# Patient Record
Sex: Female | Born: 1970 | Race: White | Hispanic: No | Marital: Married | Smoking: Never smoker
Health system: Southern US, Community
[De-identification: ages and names within clinical notes are randomized; demographics above are authoritative.]

## PROBLEM LIST (undated history)

## (undated) DIAGNOSIS — I10 Essential (primary) hypertension: Secondary | ICD-10-CM

## (undated) DIAGNOSIS — Z8659 Personal history of other mental and behavioral disorders: Secondary | ICD-10-CM

## (undated) DIAGNOSIS — R519 Headache, unspecified: Secondary | ICD-10-CM

## (undated) DIAGNOSIS — C801 Malignant (primary) neoplasm, unspecified: Secondary | ICD-10-CM

## (undated) DIAGNOSIS — Z87442 Personal history of urinary calculi: Secondary | ICD-10-CM

## (undated) DIAGNOSIS — Z8249 Family history of ischemic heart disease and other diseases of the circulatory system: Secondary | ICD-10-CM

## (undated) DIAGNOSIS — Z8719 Personal history of other diseases of the digestive system: Secondary | ICD-10-CM

## (undated) DIAGNOSIS — N200 Calculus of kidney: Secondary | ICD-10-CM

## (undated) DIAGNOSIS — Z9889 Other specified postprocedural states: Secondary | ICD-10-CM

## (undated) DIAGNOSIS — I498 Other specified cardiac arrhythmias: Secondary | ICD-10-CM

## (undated) DIAGNOSIS — R112 Nausea with vomiting, unspecified: Secondary | ICD-10-CM

## (undated) DIAGNOSIS — T7840XA Allergy, unspecified, initial encounter: Secondary | ICD-10-CM

## (undated) DIAGNOSIS — J3089 Other allergic rhinitis: Secondary | ICD-10-CM

## (undated) HISTORY — DX: Family history of ischemic heart disease and other diseases of the circulatory system: Z82.49

## (undated) HISTORY — DX: Essential (primary) hypertension: I10

## (undated) HISTORY — DX: Other specified cardiac arrhythmias: I49.8

## (undated) HISTORY — PX: LITHOTRIPSY: SUR834

## (undated) HISTORY — DX: Calculus of kidney: N20.0

## (undated) HISTORY — DX: Personal history of other mental and behavioral disorders: Z86.59

## (undated) HISTORY — DX: Other allergic rhinitis: J30.89

## (undated) HISTORY — DX: Allergy, unspecified, initial encounter: T78.40XA

## (undated) HISTORY — DX: Personal history of other diseases of the digestive system: Z87.19

---
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
## 2012-08-29 DIAGNOSIS — I10 Essential (primary) hypertension: Secondary | ICD-10-CM | POA: Insufficient documentation

## 2012-08-29 DIAGNOSIS — J3089 Other allergic rhinitis: Secondary | ICD-10-CM | POA: Insufficient documentation

## 2013-07-17 DIAGNOSIS — E538 Deficiency of other specified B group vitamins: Secondary | ICD-10-CM | POA: Insufficient documentation

## 2016-04-29 DIAGNOSIS — R591 Generalized enlarged lymph nodes: Secondary | ICD-10-CM | POA: Insufficient documentation

## 2016-10-15 DIAGNOSIS — E785 Hyperlipidemia, unspecified: Secondary | ICD-10-CM | POA: Insufficient documentation

## 2016-10-18 LAB — HM PAP SMEAR

## 2017-06-09 ENCOUNTER — Ambulatory Visit: Payer: Managed Care, Other (non HMO) | Admitting: Family Medicine

## 2017-06-14 ENCOUNTER — Other Ambulatory Visit: Payer: Self-pay

## 2017-06-14 ENCOUNTER — Ambulatory Visit (INDEPENDENT_AMBULATORY_CARE_PROVIDER_SITE_OTHER): Payer: Managed Care, Other (non HMO) | Admitting: Family Medicine

## 2017-06-14 ENCOUNTER — Encounter: Payer: Self-pay | Admitting: Family Medicine

## 2017-06-14 VITALS — BP 130/86 | HR 78 | Temp 98.8°F | Ht 64.0 in | Wt 119.6 lb

## 2017-06-14 DIAGNOSIS — Z1239 Encounter for other screening for malignant neoplasm of breast: Secondary | ICD-10-CM

## 2017-06-14 DIAGNOSIS — Z8659 Personal history of other mental and behavioral disorders: Secondary | ICD-10-CM | POA: Diagnosis not present

## 2017-06-14 DIAGNOSIS — N200 Calculus of kidney: Secondary | ICD-10-CM | POA: Diagnosis not present

## 2017-06-14 DIAGNOSIS — I10 Essential (primary) hypertension: Secondary | ICD-10-CM

## 2017-06-14 DIAGNOSIS — Z8719 Personal history of other diseases of the digestive system: Secondary | ICD-10-CM | POA: Diagnosis not present

## 2017-06-14 DIAGNOSIS — F418 Other specified anxiety disorders: Secondary | ICD-10-CM | POA: Insufficient documentation

## 2017-06-14 DIAGNOSIS — J3089 Other allergic rhinitis: Secondary | ICD-10-CM | POA: Diagnosis not present

## 2017-06-14 DIAGNOSIS — Z8249 Family history of ischemic heart disease and other diseases of the circulatory system: Secondary | ICD-10-CM | POA: Diagnosis not present

## 2017-06-14 DIAGNOSIS — Z1231 Encounter for screening mammogram for malignant neoplasm of breast: Secondary | ICD-10-CM | POA: Diagnosis not present

## 2017-06-14 HISTORY — DX: Personal history of other mental and behavioral disorders: Z86.59

## 2017-06-14 HISTORY — DX: Essential (primary) hypertension: I10

## 2017-06-14 HISTORY — DX: Other allergic rhinitis: J30.89

## 2017-06-14 HISTORY — DX: Family history of ischemic heart disease and other diseases of the circulatory system: Z82.49

## 2017-06-14 MED ORDER — SPIRONOLACTONE 50 MG PO TABS: 50.0000 mg | ORAL_TABLET | Freq: Every day | ORAL | 3 refills | Status: DC

## 2017-06-14 NOTE — Progress Notes (Signed)
Subjective  CC:  Chief Complaint  Patient presents with  . Establish Care    Transfer from [place], last physical in Summer of 2018  . Hypertension    needs refill on Spirolactone     HPI: Angie Farrell is a 47 y.o. female who presents to [place] at [facility] today to establish care with me as a new patient.   She has the following concerns or needs:  Moved to [place] in November 2018; husband transferred here [place]). 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 Happy and healthy mostly:  HTN - see PL. Doing well now on spironalactone and needs refill. Last blood work in October and reportedly normal. No CAD. Strong family history healthy lifestyle. No HLD or DM; non-smoking  H/o panic attacks: associated with son's addiction problems/recovery. Denies mood d/o or GAD. Feels well. Has xanax on hand if needed; rare use.   Perimenopausal: last menses 10/2016. No significant sxs now.   H/o kidney stones and allergies  HM: due mammogram; eye exam up to date: has premature cataracts that need removal. Pap up to date. Believes Tdap is up to date; will check her imm records.   Assessment  1. Essential hypertension   2. Nephrolithiasis   3. History of diverticulitis   4. Breast cancer screening   5. Family history of premature CAD   6. Perennial allergic rhinitis   7. History of panic attacks      Plan   HTN: good control. Refilled meds. Due labs next visit  Set up for mammogram  Chronic problems are well controlled.   Return for cpe.   Follow up:  CPE in 1-6 months Orders Placed This Encounter  Procedures  . MM DIGITAL SCREENING BILATERAL  . HM PAP SMEAR   Meds ordered this encounter  Medications  . spironolactone (ALDACTONE) 50 MG tablet    Sig: Take 1 tablet (50 mg total) by mouth daily.    Dispense:  90 tablet    Refill:  3      We updated and reviewed the patient's past history in detail and it is documented below.    Patient Active Problem List   Diagnosis Date Noted  . Essential hypertension 06/14/2017    Priority: High    Hard to control in past: situational and was using decongestants; had cardiac eval including normal 2D Echo, stress test and event monitor 2012ish Has clonidine for prn use (stress induced HTN response)   . Family history of premature CAD 06/14/2017    Priority: High  . Nephrolithiasis 06/14/2017    Priority: Medium  . History of panic attacks 06/14/2017    Priority: Medium  . History of diverticulitis 06/14/2017    Priority: Low  . Perennial allergic rhinitis 06/14/2017    Priority: Low   Health Maintenance  Topic Date Due  . HIV Screening  05/30/1984  . TETANUS/TDAP  05/30/1988  . INFLUENZA VACCINE  08/18/2017  . PAP SMEAR  10/19/2019    There is no immunization history on file for this patient. Current Meds  Medication Sig  . ALPRAZolam (XANAX) 0.5 MG tablet Take 0.5 mg by mouth daily as needed.  . cloNIDine (CATAPRES) 0.1 MG tablet Take 0.1 mg by mouth 2 (two) times daily as needed.    Allergies: Patient has No Known Allergies. Past Medical History Patient  has a past medical history of Allergy, Ectopic cardiac rhythm, Essential hypertension (06/14/2017), Family history of premature CAD (06/14/2017),  H/O diverticulitis of colon, History of panic attacks (06/14/2017), Kidney stones, and Perennial allergic rhinitis (06/14/2017). Past Surgical History Patient  has a past surgical history that includes Lithotripsy and Tonsillectomy (1981). Family History: Patient family history includes Drug abuse in her son; Healthy in her daughter; Heart disease (age of onset: 41) in her father and maternal grandmother; Hypertension in her father; Lung cancer in her maternal grandmother; Tuberculosis in her father. Social History:  Patient  reports that she has never smoked. She has never used smokeless tobacco. She reports that she drinks alcohol. She reports that she does not use  drugs.  Review of Systems: Constitutional: negative for fever or malaise Ophthalmic: negative for photophobia, double vision or loss of vision Cardiovascular: negative for chest pain, dyspnea on exertion, or new LE swelling Respiratory: negative for SOB or persistent cough Gastrointestinal: negative for abdominal pain, change in bowel habits or melena Genitourinary: negative for dysuria or gross hematuria Musculoskeletal: negative for new gait disturbance or muscular weakness Integumentary: negative for new or persistent rashes Neurological: negative for TIA or stroke symptoms Psychiatric: negative for SI or delusions Allergic/Immunologic: negative for hives  Patient Care Team    Relationship Specialty Notifications Start End  Leamon Arnt, MD PCP - General Family Medicine  06/14/17     Objective  Vitals: BP 130/86   Pulse 78   Temp 98.8 F (37.1 C)   Ht 5\' 4"  (1.626 m)   Wt 119 lb 9.6 oz (54.3 kg)   BMI 20.53 kg/m  General:  Well developed, well nourished, no acute distress , good mm tone and bulk Psych:  Alert and oriented,normal mood and affect HEENT:  Normocephalic, atraumatic, non-icteric sclera, PERRL, no thyromegaly Cardiovascular:  RRR without gallop, rub or murmur, nondisplaced PMI Respiratory:  Good breath sounds bilaterally, CTAB with normal respiratory effort MSK:no peripheral edema Neurologic:    Mental status is normal.  Normal gait   Commons side effects, risks, benefits, and alternatives for medications and treatment plan prescribed today were discussed, and the patient expressed understanding of the given instructions. Patient is instructed to call or message via MyChart if he/she has any questions or concerns regarding our treatment plan. No barriers to understanding were identified. We discussed Red Flag symptoms and signs in detail. Patient expressed understanding regarding what to do in case of urgent or emergency type symptoms.   Medication list was  reconciled, printed and provided to the patient in AVS. Patient instructions and summary information was reviewed with the patient as documented in the AVS. This note was prepared with assistance of Dragon voice recognition software. Occasional wrong-word or sound-a-like substitutions may have occurred due to the inherent limitations of voice recognition software

## 2017-06-14 NOTE — Patient Instructions (Signed)
Please return in 4-6 months for your annual complete physical; please come fasting.  Please check on when you had your last Tdap or Td shot and send me a message in [place].   We will call you with information regarding your referral appointment. Mammogram.   It was a pleasure meeting you today! Thank you for choosing [place] to meet your healthcare needs! I truly look forward to working with you. If you have any questions or concerns, please send me a message via Mychart or call the office at [phone].

## 2017-06-24 LAB — HM MAMMOGRAPHY

## 2017-07-06 ENCOUNTER — Encounter: Payer: Self-pay | Admitting: Emergency Medicine

## 2017-11-04 ENCOUNTER — Telehealth: Payer: Self-pay | Admitting: Family Medicine

## 2017-11-04 NOTE — Telephone Encounter (Signed)
Copied from [place] [id number]. Topic: Quick Communication - See Telephone Encounter >> Nov 04, 2017 12:25 PM Blase Mess A wrote: CRM for notification. See Telephone encounter for: 11/04/17.

## 2017-11-04 NOTE — Telephone Encounter (Signed)
Patient is calling to have a metabolic panel done but she does not want to schedule a CPE.  And she would like labs drawn to check her hormone levels .  Patient is requesting if labs can be taken when she comes in for her flu shot on 11/08/17. [phone]

## 2017-11-06 NOTE — Telephone Encounter (Signed)
We would need an office visit for labwork.  She is overdue for a complete physical. If she declines, then she can schedule and ov for f/u htn and to discuss hormone testing.  I cannot order blood work without evaluation. And I do recommend an annual physical as we discussed.  Thanks.

## 2017-11-07 NOTE — Telephone Encounter (Signed)
Spoke with patient. She declines CPE at this time.  HTN follow-up has been scheduled for tomorrow afternoon.

## 2017-11-08 ENCOUNTER — Ambulatory Visit (INDEPENDENT_AMBULATORY_CARE_PROVIDER_SITE_OTHER): Payer: Managed Care, Other (non HMO) | Admitting: Family Medicine

## 2017-11-08 ENCOUNTER — Ambulatory Visit: Payer: Managed Care, Other (non HMO)

## 2017-11-08 ENCOUNTER — Encounter: Payer: Self-pay | Admitting: Family Medicine

## 2017-11-08 ENCOUNTER — Other Ambulatory Visit: Payer: Self-pay

## 2017-11-08 VITALS — BP 142/96 | HR 79 | Temp 98.1°F | Ht 64.0 in | Wt 123.6 lb

## 2017-11-08 DIAGNOSIS — Z8249 Family history of ischemic heart disease and other diseases of the circulatory system: Secondary | ICD-10-CM | POA: Diagnosis not present

## 2017-11-08 DIAGNOSIS — I10 Essential (primary) hypertension: Secondary | ICD-10-CM | POA: Diagnosis not present

## 2017-11-08 DIAGNOSIS — J3089 Other allergic rhinitis: Secondary | ICD-10-CM | POA: Diagnosis not present

## 2017-11-08 DIAGNOSIS — F430 Acute stress reaction: Secondary | ICD-10-CM | POA: Diagnosis not present

## 2017-11-08 DIAGNOSIS — Z23 Encounter for immunization: Secondary | ICD-10-CM

## 2017-11-08 LAB — COMPREHENSIVE METABOLIC PANEL
ALBUMIN: 4.6 g/dL (ref 3.5–5.2)
ALT: 16 U/L (ref 0–35)
AST: 14 U/L (ref 0–37)
Alkaline Phosphatase: 78 U/L (ref 39–117)
BUN: 10 mg/dL (ref 6–23)
CHLORIDE: 104 meq/L (ref 96–112)
CO2: 31 meq/L (ref 19–32)
Calcium: 9.8 mg/dL (ref 8.4–10.5)
Creatinine, Ser: 0.98 mg/dL (ref 0.40–1.20)
GFR: 64.26 mL/min (ref >60.00)
GLUCOSE: 85 mg/dL (ref 70–99)
POTASSIUM: 4 meq/L (ref 3.5–5.1)
SODIUM: 143 meq/L (ref 135–145)
Total Bilirubin: 0.6 mg/dL (ref 0.2–1.2)
Total Protein: 7.1 g/dL (ref 6.0–8.3)

## 2017-11-08 LAB — LIPID PANEL
CHOL/HDL RATIO: 5
CHOLESTEROL: 207 mg/dL — AB (ref 0–200)
HDL: 45.7 mg/dL (ref >39.00)
LDL CALC: 131 mg/dL — AB (ref 0–99)
NonHDL: 161.34
Triglycerides: 150 mg/dL — ABNORMAL HIGH (ref 0.0–149.0)
VLDL: 30 mg/dL (ref 0.0–40.0)

## 2017-11-08 LAB — CBC WITH DIFFERENTIAL/PLATELET
BASOS PCT: 0.5 % (ref 0.0–3.0)
Basophils Absolute: 0.1 10*3/uL (ref 0.0–0.1)
Eosinophils Absolute: 0.3 10*3/uL (ref 0.0–0.7)
Eosinophils Relative: 2.6 % (ref 0.0–5.0)
HCT: 39 % (ref 36.0–46.0)
Hemoglobin: 13.2 g/dL (ref 12.0–15.0)
LYMPHS PCT: 32.2 % (ref 12.0–46.0)
Lymphs Abs: 3.5 10*3/uL (ref 0.7–4.0)
MCHC: 33.7 g/dL (ref 30.0–36.0)
MCV: 92.3 fl (ref 78.0–100.0)
Monocytes Absolute: 0.5 10*3/uL (ref 0.1–1.0)
Monocytes Relative: 4.6 % (ref 3.0–12.0)
NEUTROS ABS: 6.5 10*3/uL (ref 1.4–7.7)
NEUTROS PCT: 60.1 % (ref 43.0–77.0)
PLATELETS: 278 10*3/uL (ref 150.0–400.0)
RBC: 4.23 Mil/uL (ref 3.87–5.11)
RDW: 12.5 % (ref 11.5–15.5)
WBC: 10.8 10*3/uL — ABNORMAL HIGH (ref 4.0–10.5)

## 2017-11-08 LAB — MAGNESIUM: Magnesium: 2.2 mg/dL (ref 1.5–2.5)

## 2017-11-08 LAB — TSH: TSH: 0.81 u[IU]/mL (ref 0.35–4.50)

## 2017-11-08 NOTE — Telephone Encounter (Signed)
FYI

## 2017-11-08 NOTE — Progress Notes (Signed)
Subjective  CC:  Chief Complaint  Patient presents with  . Hypertension    Patient states that her blood pressures have been running high since Saturday, wants to discuss getting some bloodwork done    HPI: Angie Farrell is a 47 y.o. female who presents to the office today to address the problems listed above in the chief complaint.  Hypertension f/u: Control is fair with several elevated readings over the last 2 weeks; thought to be stress related: daughter had pituitary adenoma removed surgically (lives in [place]). Pt has been traveling to see her and trying to help but it is difficult due to the long distance. She is stressed and working. Has long h/o of hard to control HTN with stress related hypertensive response. Had 2 elevated readings responsive to clonodine (had been told to use prn). BP on avg runs 140/80s - 90s.  Pt reports she can tell when her bp is high - feels tired with headaches. She is taking medications as instructed, no medication side effects noted, no TIAs, no chest pain on exertion, no dyspnea on exertion, no swelling of ankles. She denies adverse effects from his BP medications. Compliance with medication is good.   She is due for labs. She is nonfasting.   Allergies are active w/o sxs of infection.   Assessment  1. Essential hypertension   2. Family history of premature CAD   3. Perennial allergic rhinitis   4. Stress reaction      Plan    Hypertension f/u: BP control is poorly controlled. Significant hypertensive response to stress. Add clonidine bid to aldactone and monitor home readings. Check labs. Education given  Hyperlipidemia f/u: borderline readings in past. Check today and recalculate ascvd risk score. May need statin. Lives health lifestyle  AR - avoid decongestants.   Stress reduction discussed.  Education regarding management of these chronic disease states was given. Management strategies discussed on successive visits include dietary and  exercise recommendations, goals of achieving and maintaining IBW, and lifestyle modifications aiming for adequate sleep and minimizing stressors.   Follow up: No follow-ups on file.  Orders Placed This Encounter  Procedures  . CBC with Differential/Platelet  . Comprehensive metabolic panel  . Lipid panel  . TSH  . Magnesium  . POCT urinalysis dipstick   No orders of the defined types were placed in this encounter.     BP Readings from Last 3 Encounters:  11/08/17 (!) 142/96  06/14/17 130/86   Wt Readings from Last 3 Encounters:  11/08/17 123 lb 9.6 oz (56.1 kg)  06/14/17 119 lb 9.6 oz (54.3 kg)    No results found for: CHOL No results found for: HDL No results found for: LDLCALC No results found for: TRIG No results found for: CHOLHDL No results found for: LDLDIRECT No results found for: CREATININE, BUN, NA, K, CL, CO2  The 10-year ASCVD risk score Mikey Bussing DC Jr., et al., 2013) is: 2.2%   Values used to calculate the score:     Age: 51 years     Sex: Female     Is Non-Hispanic African American: No     Diabetic: No     Tobacco smoker: No     Systolic Blood Pressure: 786 mmHg     Is BP treated: Yes     HDL Cholesterol: 49 mg/dl     Total Cholesterol: 212 mg/dl  I reviewed the patients updated PMH, FH, and SocHx.    Patient Active Problem List   Diagnosis  Date Noted  . Essential hypertension 06/14/2017    Priority: High  . Family history of premature CAD 06/14/2017    Priority: High  . Nephrolithiasis 06/14/2017    Priority: Medium  . History of panic attacks 06/14/2017    Priority: Medium  . History of diverticulitis 06/14/2017    Priority: Low  . Perennial allergic rhinitis 06/14/2017    Priority: Low    Allergies: Patient has no known allergies.  Social History: Patient  reports that she has never smoked. She has never used smokeless tobacco. She reports that she drinks alcohol. She reports that she does not use drugs.  Current Meds  Medication Sig    . cloNIDine (CATAPRES) 0.1 MG tablet Take 0.1 mg by mouth 2 (two) times daily.  Marland Kitchen spironolactone (ALDACTONE) 50 MG tablet Take 1 tablet (50 mg total) by mouth daily.    Review of Systems: Cardiovascular: negative for chest pain, palpitations, leg swelling, orthopnea Respiratory: negative for SOB, wheezing or persistent cough Gastrointestinal: negative for abdominal pain Genitourinary: negative for dysuria or gross hematuria  Objective  Vitals: BP (!) 142/96   Pulse 79   Temp 98.1 F (36.7 C)   Ht 5\' 4"  (1.626 m)   Wt 123 lb 9.6 oz (56.1 kg)   SpO2 96%   BMI 21.22 kg/m  General: no acute distress  Psych:  Alert and oriented, normal mood and affect HEENT:  Normocephalic, atraumatic, supple neck , no thyromeglay Cardiovascular:  RRR without murmur. no edema Respiratory:  Good breath sounds bilaterally, CTAB with normal respiratory effort Skin:  Warm, no rashes Neurologic:   Mental status is normal, no tremor  Commons side effects, risks, benefits, and alternatives for medications and treatment plan prescribed today were discussed, and the patient expressed understanding of the given instructions. Patient is instructed to call or message via MyChart if he/she has any questions or concerns regarding our treatment plan. No barriers to understanding were identified. We discussed Red Flag symptoms and signs in detail. Patient expressed understanding regarding what to do in case of urgent or emergency type symptoms.   Medication list was reconciled, printed and provided to the patient in AVS. Patient instructions and summary information was reviewed with the patient as documented in the AVS. This note was prepared with assistance of Dragon voice recognition software. Occasional wrong-word or sound-a-like substitutions may have occurred due to the inherent limitations of voice recognition software

## 2017-11-08 NOTE — Patient Instructions (Addendum)
Please return in 3 months for for follow up of your hypertension.   Start taking the clonidine twice a day regularly. IF your BP is getting to low, we can decrease the spironolactone to 25mg  (1/2 tab) per day.   I will release your lab results to you on your MyChart account with further instructions. Please reply with any questions.   If you have any questions or concerns, please don't hesitate to send me a message via MyChart or call the office at [phone]. Thank you for visiting with Angie Farrell today! It's our pleasure caring for you.   Hypertension Hypertension, commonly called high blood pressure, is when the force of blood pumping through the arteries is too strong. The arteries are the blood vessels that carry blood from the heart throughout the body. Hypertension forces the heart to work harder to pump blood and may cause arteries to become narrow or stiff. Having untreated or uncontrolled hypertension can cause heart attacks, strokes, kidney disease, and other problems. A blood pressure reading consists of a higher number over a lower number. Ideally, your blood pressure should be below 120/80. The first ("top") number is called the systolic pressure. It is a measure of the pressure in your arteries as your heart beats. The second ("bottom") number is called the diastolic pressure. It is a measure of the pressure in your arteries as the heart relaxes. What are the causes? The cause of this condition is not known. What increases the risk? Some risk factors for high blood pressure are under your control. Others are not. Factors you can change  Smoking.  Having type 2 diabetes mellitus, high cholesterol, or both.  Not getting enough exercise or physical activity.  Being overweight.  Having too much fat, sugar, calories, or salt (sodium) in your diet.  Drinking too much alcohol. Factors that are difficult or impossible to change  Having chronic kidney disease.  Having a family  history of high blood pressure.  Age. Risk increases with age.  Race. You may be at higher risk if you are African-American.  Gender. Men are at higher risk than women before age 17. After age 47, women are at higher risk than men.  Having obstructive sleep apnea.  Stress. What are the signs or symptoms? Extremely high blood pressure (hypertensive crisis) may cause:  Headache.  Anxiety.  Shortness of breath.  Nosebleed.  Nausea and vomiting.  Severe chest pain.  Jerky movements you cannot control (seizures).  How is this diagnosed? This condition is diagnosed by measuring your blood pressure while you are seated, with your arm resting on a surface. The cuff of the blood pressure monitor will be placed directly against the skin of your upper arm at the level of your heart. It should be measured at least twice using the same arm. Certain conditions can cause a difference in blood pressure between your right and left arms. Certain factors can cause blood pressure readings to be lower or higher than normal (elevated) for a short period of time:  When your blood pressure is higher when you are in a health care provider's office than when you are at home, this is called white coat hypertension. Most people with this condition do not need medicines.  When your blood pressure is higher at home than when you are in a health care provider's office, this is called masked hypertension. Most people with this condition may need medicines to control blood pressure.  If you have a high blood pressure reading  during one visit or you have normal blood pressure with other risk factors:  You may be asked to return on a different day to have your blood pressure checked again.  You may be asked to monitor your blood pressure at home for 1 week or longer.  If you are diagnosed with hypertension, you may have other blood or imaging tests to help your health care provider understand your overall  risk for other conditions. How is this treated? This condition is treated by making healthy lifestyle changes, such as eating healthy foods, exercising more, and reducing your alcohol intake. Your health care provider may prescribe medicine if lifestyle changes are not enough to get your blood pressure under control, and if:  Your systolic blood pressure is above 130.  Your diastolic blood pressure is above 80.  Your personal target blood pressure may vary depending on your medical conditions, your age, and other factors. Follow these instructions at home: Eating and drinking  Eat a diet that is high in fiber and potassium, and low in sodium, added sugar, and fat. An example eating plan is called the DASH (Dietary Approaches to Stop Hypertension) diet. To eat this way: ? Eat plenty of fresh fruits and vegetables. Try to fill half of your plate at each meal with fruits and vegetables. ? Eat whole grains, such as whole wheat pasta, brown rice, or whole grain bread. Fill about one quarter of your plate with whole grains. ? Eat or drink low-fat dairy products, such as skim milk or low-fat yogurt. ? Avoid fatty cuts of meat, processed or cured meats, and poultry with skin. Fill about one quarter of your plate with lean proteins, such as fish, chicken without skin, beans, eggs, and tofu. ? Avoid premade and processed foods. These tend to be higher in sodium, added sugar, and fat.  Reduce your daily sodium intake. Most people with hypertension should eat less than 1,500 mg of sodium a day.  Limit alcohol intake to no more than 1 drink a day for nonpregnant women and 2 drinks a day for men. One drink equals 12 oz of beer, 5 oz of wine, or 1 oz of hard liquor. Lifestyle  Work with your health care provider to maintain a healthy body weight or to lose weight. Ask what an ideal weight is for you.  Get at least 30 minutes of exercise that causes your heart to beat faster (aerobic exercise) most days  of the week. Activities may include walking, swimming, or biking.  Include exercise to strengthen your muscles (resistance exercise), such as pilates or lifting weights, as part of your weekly exercise routine. Try to do these types of exercises for 30 minutes at least 3 days a week.  Do not use any products that contain nicotine or tobacco, such as cigarettes and e-cigarettes. If you need help quitting, ask your health care provider.  Monitor your blood pressure at home as told by your health care provider.  Keep all follow-up visits as told by your health care provider. This is important. Medicines  Take over-the-counter and prescription medicines only as told by your health care provider. Follow directions carefully. Blood pressure medicines must be taken as prescribed.  Do not skip doses of blood pressure medicine. Doing this puts you at risk for problems and can make the medicine less effective.  Ask your health care provider about side effects or reactions to medicines that you should watch for. Contact a health care provider if:  You think  you are having a reaction to a medicine you are taking.  You have headaches that keep coming back (recurring).  You feel dizzy.  You have swelling in your ankles.  You have trouble with your vision. Get help right away if:  You develop a severe headache or confusion.  You have unusual weakness or numbness.  You feel faint.  You have severe pain in your chest or abdomen.  You vomit repeatedly.  You have trouble breathing. Summary  Hypertension is when the force of blood pumping through your arteries is too strong. If this condition is not controlled, it may put you at risk for serious complications.  Your personal target blood pressure may vary depending on your medical conditions, your age, and other factors. For most people, a normal blood pressure is less than 120/80.  Hypertension is treated with lifestyle changes, medicines,  or a combination of both. Lifestyle changes include weight loss, eating a healthy, low-sodium diet, exercising more, and limiting alcohol. This information is not intended to replace advice given to you by your health care provider. Make sure you discuss any questions you have with your health care provider. Document Released: 01/04/2005 Document Revised: 12/03/2015 Document Reviewed: 12/03/2015 Elsevier Interactive Patient Education  [employer].

## 2017-12-28 ENCOUNTER — Other Ambulatory Visit: Payer: Self-pay | Admitting: Family Medicine

## 2017-12-28 NOTE — Telephone Encounter (Signed)
Patient would like a prescription of Clonidine to take every other day, because it works better.  [id number] - [place], [address] [place] [phone] (Phone) [phone] (Fax)

## 2017-12-28 NOTE — Telephone Encounter (Signed)
Copied from [place] [id number]. Topic: Quick Communication - Rx Refill/Question >> Dec 28, 2017  9:45 AM Alfredia Ferguson R wrote: Medication: cloNIDine (CATAPRES) 0.1 MG tablet , would like new script to take once every other day due to working better  Has the patient contacted their pharmacy? Yes ,   Preferred Pharmacy (with phone number or street name): [id number] - [place], [address] [phone] (Phone) [phone] (Fax)    Agent: Please be advised that RX refills may take up to 3 business days. We ask that you follow-up with your pharmacy.

## 2017-12-28 NOTE — Telephone Encounter (Signed)
Routing to PCP

## 2017-12-29 NOTE — Telephone Encounter (Signed)
I would not recommend using hydralazine every other day.  Please continue bid dosing and refill if she needs that.  If she is having problems with her blood pressure, please schedule an office visit to discuss. I do not want to adjust medications without evaluating her blood pressure readings.

## 2017-12-29 NOTE — Telephone Encounter (Signed)
Patient states that taking the Clonidine 2 times per day drops her blood pressure too low. She said taking it every other day, controls her blood pressure. I advised patient in order to change the dosages of her medications she would need to make an appointment for an office visit. I offered to schedule an appointment for her and the phone cut off, I called the patient back to see if she would like to schedule and she replied no.   Doloris Hall,  LPN

## 2020-04-25 ENCOUNTER — Ambulatory Visit: Payer: Managed Care, Other (non HMO) | Admitting: Cardiology

## 2020-05-08 DIAGNOSIS — I701 Atherosclerosis of renal artery: Secondary | ICD-10-CM | POA: Diagnosis present

## 2020-06-10 ENCOUNTER — Other Ambulatory Visit: Payer: Self-pay | Admitting: Nephrology

## 2020-06-10 DIAGNOSIS — N200 Calculus of kidney: Secondary | ICD-10-CM

## 2020-06-10 DIAGNOSIS — I10 Essential (primary) hypertension: Secondary | ICD-10-CM

## 2020-06-10 DIAGNOSIS — I701 Atherosclerosis of renal artery: Secondary | ICD-10-CM

## 2020-06-10 DIAGNOSIS — E785 Hyperlipidemia, unspecified: Secondary | ICD-10-CM

## 2020-06-11 ENCOUNTER — Ambulatory Visit: Payer: Managed Care, Other (non HMO) | Admitting: Cardiology

## 2020-06-11 ENCOUNTER — Encounter: Payer: Self-pay | Admitting: Cardiology

## 2020-06-11 ENCOUNTER — Other Ambulatory Visit: Payer: Self-pay

## 2020-06-11 VITALS — BP 200/108 | HR 63 | Temp 98.3°F | Resp 16 | Ht 64.0 in | Wt 138.6 lb

## 2020-06-11 DIAGNOSIS — E7800 Pure hypercholesterolemia, unspecified: Secondary | ICD-10-CM

## 2020-06-11 DIAGNOSIS — Z8249 Family history of ischemic heart disease and other diseases of the circulatory system: Secondary | ICD-10-CM

## 2020-06-11 DIAGNOSIS — R9431 Abnormal electrocardiogram [ECG] [EKG]: Secondary | ICD-10-CM

## 2020-06-11 DIAGNOSIS — I10 Essential (primary) hypertension: Secondary | ICD-10-CM

## 2020-06-11 MED ORDER — CARVEDILOL 6.25 MG PO TABS: 6.2500 mg | ORAL_TABLET | Freq: Two times a day (BID) | ORAL | 2 refills | Status: DC

## 2020-06-11 NOTE — Progress Notes (Signed)
Primary Physician/Referring:  Mancel Bale, PA-C  Patient ID: Angie Farrell, female    DOB: Mar 28, 1970, 50 y.o.   MRN: [id number]  Chief Complaint  Patient presents with  . Family Hx of premature CAD  . New Patient (Initial Visit)    Referred by Windell Hummingbird, PA-C   HPI:    Angie Farrell  is a 50 y.o. Caucasian female with history of hypertension, hyperlipidemia, family history of premature CAD, as well as anxiety.  She was referred to our office by PCP for further cardiovascular risk stratification and management of hypertension.    Patient recently about 2 years ago moved from [place], [place], she is a coding person for medical practices.  She was being followed by Dr. Phil Dopp (Cardiology), and has had hypertension since she was approximately 50 years of age and has had extensive work-up with regard to blood testing for resident hypertension.  She is presently on multiple medications and was recently evaluated by Dr. Gean Quint and was started on olmesartan 20 mg daily.  Patient states that even minimal dose of chlorthalidone causes hypotension and she was tried on carvedilol but caused her to have severe hypertension as well and had chest pain and not feeling well and has discontinued this.  She takes clonidine on a as needed basis for elevated blood pressure >SBP 160 mmHg.  She also states that her father had coronary artery disease and he was a non-smoker and nondiabetic and is late 5s and early 49s years of age.  She is concerned about her elevated lipids, after recent evaluation by her PCP, she is trying to even more strict with her diet trying to get her LDL down.  She is concerned about vascular disease as well.  Past Medical History:  Diagnosis Date  . Allergy   . Ectopic cardiac rhythm   . Essential hypertension 06/14/2017  . Family history of premature CAD 06/14/2017  . H/O diverticulitis of colon   . History of panic attacks 06/14/2017  . Kidney stones    X 8  . Perennial  allergic rhinitis 06/14/2017   Past Surgical History:  Procedure Laterality Date  . LITHOTRIPSY     X 3  . TONSILLECTOMY  1981   Family History  Problem Relation Age of Onset  . Hypertension Mother   . Heart disease Father 58  . Hypertension Father   . Tuberculosis Father   . Heart attack Father   . Hypertension Brother   . Healthy Daughter   . Drug abuse Son        heroine  . Lung cancer Maternal Grandmother   . Heart disease Maternal Grandmother 73  . Heart attack Maternal Grandmother   . Hearing loss Sister   . Hyperlipidemia Sister   . Hypertension Sister     Social History   Tobacco Use  . Smoking status: Never Smoker  . Smokeless tobacco: Never Used  Substance Use Topics  . Alcohol use: Yes    Comment: social   Marital Status: Married   ROS  Review of Systems  Cardiovascular: Negative for chest pain, dyspnea on exertion and leg swelling.  Gastrointestinal: Negative for melena.    Objective  Blood pressure (!) 200/108, pulse 63, temperature 98.3 F (36.8 C), temperature source Temporal, resp. rate 16, height 5' 4"  (1.626 m), weight 138 lb 9.6 oz (62.9 kg), SpO2 100 %.  Vitals with BMI 06/11/2020 06/11/2020 06/11/2020  Height - - 5' 4"   Weight - - 138 lbs 10  oz  BMI - - 16.55  Systolic 374 827 078  Diastolic 675 80 84  Pulse - 63 64      Physical Exam Constitutional:      Appearance: She is normal weight.  Neck:     Vascular: No carotid bruit or JVD.  Cardiovascular:     Rate and Rhythm: Normal rate and regular rhythm.     Pulses: Intact distal pulses.     Heart sounds: Normal heart sounds. No murmur heard. No gallop.   Pulmonary:     Effort: Pulmonary effort is normal.     Breath sounds: Normal breath sounds.  Abdominal:     General: Bowel sounds are normal.     Palpations: Abdomen is soft.  Musculoskeletal:        General: No swelling.    Laboratory examination:   Lipid Panel No results for input(s): CHOL, TRIG, LDLCALC, VLDL, HDL,  CHOLHDL, LDLDIRECT in the last 8760 hours.  HEMOGLOBIN A1C No results found for: HGBA1C, MPG  External labs:    04/08/2020: Total cholesterol 224, triglycerides 136, HDL 35, LDL 164  TSH 1.37, free T4 0.96  Glucose 87, BUN 12, creatinine 0.85, GFR 83, sodium 139, potassium 4.2, alk phos 100, AST 14, ALT 11  Hemoglobin 13.9, hematocrit 42.1, MCV 94, platelet 273  Urine analysis with reflex normal without proteinuria.  10/08/2016: A1c 5.5%   Medications and allergies  No Known Allergies   Outpatient Medications Prior to Visit  Medication Sig Dispense Refill  . ALPRAZolam (XANAX) 0.5 MG tablet Take 0.5 mg by mouth daily as needed.    . cloNIDine (CATAPRES) 0.1 MG tablet Take 0.1 mg by mouth 2 (two) times daily as needed (SBP > 190 mm Hg).    Marland Kitchen olmesartan (BENICAR) 20 MG tablet Take 20 mg by mouth daily.    Marland Kitchen spironolactone (ALDACTONE) 50 MG tablet Take 1 tablet (50 mg total) by mouth daily. 90 tablet 3  . metoprolol succinate (TOPROL-XL) 50 MG 24 hr tablet Take 1 tablet by mouth daily.    . progesterone (PROMETRIUM) 200 MG capsule Take 200 mg by mouth daily. Error entry     No facility-administered medications prior to visit.   Medication as of today:  Current Meds  Medication Sig  . ALPRAZolam (XANAX) 0.5 MG tablet Take 0.5 mg by mouth daily as needed.  . carvedilol (COREG) 6.25 MG tablet Take 1 tablet (6.25 mg total) by mouth 2 (two) times daily.  . cloNIDine (CATAPRES) 0.1 MG tablet Take 0.1 mg by mouth 2 (two) times daily as needed (SBP > 190 mm Hg).  Marland Kitchen olmesartan (BENICAR) 20 MG tablet Take 20 mg by mouth daily.  Marland Kitchen spironolactone (ALDACTONE) 50 MG tablet Take 1 tablet (50 mg total) by mouth daily.  . [DISCONTINUED] metoprolol succinate (TOPROL-XL) 50 MG 24 hr tablet Take 1 tablet by mouth daily.     Radiology:   No results found.  Cardiac Studies:   Bilateral renal artery duplex 05/02/2020: 1. Elevated right renal artery peak systolic velocity with renal artery  to aortic ratio measuring 2.7. Findings are suggestive of renal artery stenosis/narrowing less than 60%. 2. No evidence of any left renal artery stenosis.  3. 2.8 cm gallstone   EKG:   EKG 06/11/2020: Sinus rhythm with short PR interval, PR interval 110 ms.  Normal axis, no evidence of ischemia, otherwise normal EKG.     Assessment     ICD-10-CM   1. Resistant hypertension  I10 EKG 12-Lead  carvedilol (COREG) 6.25 MG tablet    [facility] ECHOCARDIOGRAM COMPLETE  2. Hypercholesterolemia  E78.00 CT CARDIAC SCORING ([facility] LOCATIONS ONLY)  3. Hypercholesteremia  E78.00   4. Shortened PR interval  R94.31   5. Family history of premature CAD - Father CAD in 46s  Z38.49 CT CARDIAC SCORING ([facility] LOCATIONS ONLY)     Medications Discontinued During This Encounter  Medication Reason  . progesterone (PROMETRIUM) 200 MG capsule Discontinued by provider  . progesterone (PROMETRIUM) 200 MG capsule Discontinued by provider  . metoprolol succinate (TOPROL-XL) 50 MG 24 hr tablet Change in therapy    Meds ordered this encounter  Medications  . carvedilol (COREG) 6.25 MG tablet    Sig: Take 1 tablet (6.25 mg total) by mouth 2 (two) times daily.    Dispense:  60 tablet    Refill:  2    Recommendations:   Angie Farrell is a 50 y.o. Caucasian female with history of hypertension, hyperlipidemia, family history of premature CAD, as well as anxiety.  She was referred to our office by PCP for further cardiovascular risk stratification and management of hypertension.    Patient recently about 2 years ago moved from [place], [place], she is a coding person for medical practices. She also concerned about family history and hyperlipidemia, father had coronary artery disease and he was a non-smoker and nondiabetic and is late 53s and early 71s years of age.   Patient previously had tried 25 mg carvedilol at 12.5 twice daily and developed severe hypotension.  I suspect it may have been related to the fact that she may have  used clonidine at the same time, presently using clonidine on a as needed basis for SBP >160 mmHg.  Advised her to discontinue metoprolol succinate 50 mg daily and to try carvedilol at 6.25 mg twice daily and if blood pressure is uncontrolled she could try going up to 12.5 mg twice daily and to avoid using clonidine at the same time.  I reviewed her renal duplex, she has normal renal size bilateral, she has now been scheduled for CT angiogram of the abdomen to eval for renal artery stenosis and probably FMD.  I will follow-up on this. If RAS confirmed will probably proceed with renal arteriogram.  With regard to family history of premature coronary artery disease and hyperlipidemia, she is already on a strict diet and follows very strict diet and exercise program, doubt her LDL will improve any further, would like to update coronary calcium score for further restratification and have a low threshold for starting statin therapy.  I will also obtain echocardiogram in view of resistant hypertension and to evaluate for hypertensive heart disease.  Her EKG demonstrates short PR interval with WPW pattern but without syndrome.  I will see her back in 4 to 6 weeks for follow-up.  This was a 60-minute office visit encounter with evaluation of external records and labs and coordination of care.   Adrian Prows, MD, [facility] 06/11/2020, 11:00 PM Office: [phone] Fax: [phone] Pager: [phone]   CC: Dr. Gean Quint (nephrology); Ms. Windell Hummingbird, PA-C

## 2020-07-01 ENCOUNTER — Inpatient Hospital Stay: Admission: RE | Admit: 2020-07-01 | Payer: Managed Care, Other (non HMO) | Source: Ambulatory Visit

## 2020-07-01 ENCOUNTER — Ambulatory Visit
Admission: RE | Admit: 2020-07-01 | Discharge: 2020-07-01 | Disposition: A | Discharge status: Home / Self Care | Payer: Managed Care, Other (non HMO) | Source: Ambulatory Visit | Attending: Nephrology | Admitting: Nephrology

## 2020-07-01 DIAGNOSIS — N200 Calculus of kidney: Secondary | ICD-10-CM

## 2020-07-01 DIAGNOSIS — E785 Hyperlipidemia, unspecified: Secondary | ICD-10-CM

## 2020-07-01 DIAGNOSIS — I10 Essential (primary) hypertension: Secondary | ICD-10-CM

## 2020-07-01 DIAGNOSIS — I701 Atherosclerosis of renal artery: Secondary | ICD-10-CM

## 2020-07-01 MED ORDER — IOPAMIDOL (ISOVUE-370) INJECTION 76%
75.0000 mL | Freq: Once | INTRAVENOUS | Status: AC | PRN
  Administered 2020-07-01: 75 mL via INTRAVENOUS

## 2020-07-04 ENCOUNTER — Ambulatory Visit
Admission: RE | Admit: 2020-07-04 | Discharge: 2020-07-04 | Disposition: A | Discharge status: Home / Self Care | Payer: Managed Care, Other (non HMO) | Source: Ambulatory Visit | Attending: Cardiology | Admitting: Cardiology

## 2020-07-04 DIAGNOSIS — Z8249 Family history of ischemic heart disease and other diseases of the circulatory system: Secondary | ICD-10-CM

## 2020-07-04 DIAGNOSIS — E7800 Pure hypercholesterolemia, unspecified: Secondary | ICD-10-CM

## 2020-07-05 NOTE — Progress Notes (Signed)
Coronary calcium score  07/10/2020: Coronary calcium score of 0, ascending and descending aortic measurements are normal, Noncardiac findings include right middle lobe 2 to 3 mm nodule most likely a subpleural lymph nodes.  If high risk consider noncontrast CT in 12 months.

## 2020-07-09 ENCOUNTER — Ambulatory Visit: Payer: Managed Care, Other (non HMO)

## 2020-07-09 ENCOUNTER — Other Ambulatory Visit: Payer: Self-pay

## 2020-07-09 DIAGNOSIS — I10 Essential (primary) hypertension: Secondary | ICD-10-CM

## 2020-07-14 ENCOUNTER — Other Ambulatory Visit: Payer: Managed Care, Other (non HMO)

## 2020-07-17 NOTE — Progress Notes (Signed)
Echocardiogram 07/09/2020: Normal LV systolic function with visual EF 60-65%. Left ventricle cavity is normal in size. Mild left ventricular hypertrophy. Normal global wall motion. Normal diastolic filling pattern, normal LAP. Mild tricuspid regurgitation. No evidence of pulmonary hypertension. No prior study for comparison.

## 2020-07-23 ENCOUNTER — Ambulatory Visit: Payer: Managed Care, Other (non HMO) | Admitting: Cardiology

## 2020-07-23 ENCOUNTER — Encounter: Payer: Self-pay | Admitting: Cardiology

## 2020-07-23 ENCOUNTER — Other Ambulatory Visit: Payer: Self-pay

## 2020-07-23 VITALS — BP 182/98 | HR 59 | Temp 98.4°F | Resp 16 | Ht 64.0 in | Wt 132.8 lb

## 2020-07-23 DIAGNOSIS — I701 Atherosclerosis of renal artery: Secondary | ICD-10-CM

## 2020-07-23 DIAGNOSIS — I10 Essential (primary) hypertension: Secondary | ICD-10-CM

## 2020-07-23 DIAGNOSIS — E7800 Pure hypercholesterolemia, unspecified: Secondary | ICD-10-CM

## 2020-07-23 DIAGNOSIS — I773 Arterial fibromuscular dysplasia: Secondary | ICD-10-CM

## 2020-07-23 DIAGNOSIS — Z8249 Family history of ischemic heart disease and other diseases of the circulatory system: Secondary | ICD-10-CM

## 2020-07-23 MED ORDER — DILTIAZEM HCL ER COATED BEADS 180 MG PO CP24: 180.0000 mg | ORAL_CAPSULE | Freq: Every day | ORAL | 2 refills | Status: DC

## 2020-07-23 MED ORDER — ROSUVASTATIN CALCIUM 20 MG PO TABS: 20.0000 mg | ORAL_TABLET | Freq: Every day | ORAL | 2 refills | Status: DC

## 2020-07-23 NOTE — H&P (View-Only) (Signed)
Primary Physician/Referring:  Mancel Bale, PA-C  Patient ID: Angie Farrell, female    DOB: 1970/10/17, 50 y.o.   MRN: [id number]  Chief Complaint  Patient presents with   RESISTANT HYPERTENSION   Hyperlipidemia    4 WEEKS   HPI:    Angie Farrell  is a 50 y.o. Caucasian female with history of hypertension, hyperlipidemia, family history of premature CAD, as well as anxiety. She was a non-smoker and nondiabetic and is late 50s and early 51s years of age.  She was referred to our office by PCP for further cardiovascular risk stratification and management of resistant hypertension.    Patient recently about 2 years ago moved from [place], [place], she is a coding person for medical practices.  Patient is very sensitive to chlorthalidone causing hypotension and also carvedilol causing hypotension as well however I had tried her on low-dose of carvedilol on a prior office visit 6 weeks ago.  Stated that she did not feel well for chest pain and dizziness and hence discontinued this and is back on metoprolol.  Otherwise no specific complaints today, continues to have elevated blood pressure.     Past Medical History:  Diagnosis Date   Allergy    Ectopic cardiac rhythm    Essential hypertension 06/14/2017   Family history of premature CAD 06/14/2017   H/O diverticulitis of colon    History of panic attacks 06/14/2017   Kidney stones    X 8   Perennial allergic rhinitis 06/14/2017   Past Surgical History:  Procedure Laterality Date   LITHOTRIPSY     X 3   TONSILLECTOMY  1981   Family History  Problem Relation Age of Onset   Hypertension Mother    Heart disease Father 11   Hypertension Father    Tuberculosis Father    Heart attack Father    Hypertension Brother    Healthy Daughter    Drug abuse Son        heroine   Lung cancer Maternal Grandmother    Heart disease Maternal Grandmother 43   Heart attack Maternal Grandmother    Hearing loss Sister    Hyperlipidemia Sister     Hypertension Sister     Social History   Tobacco Use   Smoking status: Never   Smokeless tobacco: Never  Substance Use Topics   Alcohol use: Yes    Comment: social   Marital Status: Married   ROS  Review of Systems  Cardiovascular:  Negative for chest pain, dyspnea on exertion and leg swelling.  Gastrointestinal:  Negative for melena.   Objective  Blood pressure (!) 182/98, pulse (!) 59, temperature 98.4 F (36.9 C), temperature source Temporal, resp. rate 16, height _0  (1.626 m), weight 132 lb 12.8 oz (60.2 kg), SpO2 100 %.  Vitals with BMI 07/23/2020 07/23/2020 06/11/2020  Height - _1  -  Weight - 132 lbs 13 oz -  BMI - 95.62 -  Systolic 130 865 784  Diastolic 98 94 696  Pulse 59 66 -      Physical Exam Constitutional:      Appearance: She is normal weight.  Neck:     Vascular: No carotid bruit or JVD.  Cardiovascular:     Rate and Rhythm: Normal rate and regular rhythm.     Pulses: Intact distal pulses.     Heart sounds: Normal heart sounds. No murmur heard.   No gallop.  Pulmonary:     Effort: Pulmonary effort is normal.  Breath sounds: Normal breath sounds.  Abdominal:     General: Bowel sounds are normal.     Palpations: Abdomen is soft.  Musculoskeletal:        General: No swelling.   Laboratory examination:   Lipid Panel No results for input(s): CHOL, TRIG, LDLCALC, VLDL, HDL, CHOLHDL, LDLDIRECT in the last 8760 hours.  HEMOGLOBIN A1C No results found for: HGBA1C, MPG  External labs:    04/08/2020: Total cholesterol 224, triglycerides 136, HDL 35, LDL 164  TSH 1.37, free T4 0.96  Glucose 87, BUN 12, creatinine 0.85, GFR 83, sodium 139, potassium 4.2, alk phos 100, AST 14, ALT 11  Hemoglobin 13.9, hematocrit 42.1, MCV 94, platelet 273  Urine analysis with reflex normal without proteinuria.  10/08/2016: A1c 5.5%   Medications and allergies  No Known Allergies   Outpatient Medications Prior to Visit  Medication Sig Dispense Refill    ALPRAZolam (XANAX) 0.5 MG tablet Take 0.5 mg by mouth daily as needed.     cloNIDine (CATAPRES) 0.1 MG tablet Take 0.1 mg by mouth 2 (two) times daily as needed (SBP > 190 mm Hg).     metoprolol succinate (TOPROL-XL) 50 MG 24 hr tablet Take 50 mg by mouth daily.     olmesartan (BENICAR) 20 MG tablet Take 20 mg by mouth daily.     spironolactone (ALDACTONE) 50 MG tablet Take 1 tablet (50 mg total) by mouth daily. 90 tablet 3   carvedilol (COREG) 6.25 MG tablet Take 1 tablet (6.25 mg total) by mouth 2 (two) times daily. 60 tablet 2   No facility-administered medications prior to visit.   Medication as of today:  Current Meds  Medication Sig   ALPRAZolam (XANAX) 0.5 MG tablet Take 0.5 mg by mouth daily as needed.   cloNIDine (CATAPRES) 0.1 MG tablet Take 0.1 mg by mouth 2 (two) times daily as needed (SBP > 190 mm Hg).   diltiazem (CARDIZEM CD) 180 MG 24 hr capsule Take 1 capsule (180 mg total) by mouth daily.   metoprolol succinate (TOPROL-XL) 50 MG 24 hr tablet Take 50 mg by mouth daily.   olmesartan (BENICAR) 20 MG tablet Take 20 mg by mouth daily.   rosuvastatin (CRESTOR) 20 MG tablet Take 1 tablet (20 mg total) by mouth daily.   spironolactone (ALDACTONE) 50 MG tablet Take 1 tablet (50 mg total) by mouth daily.     Radiology:   No results found.  Cardiac Studies:   Bilateral renal artery duplex 05/02/2020: 1. Elevated right renal artery peak systolic velocity with renal artery to aortic ratio measuring 2.7. Findings are suggestive of renal artery stenosis/narrowing less than 60%. 2. No evidence of any left renal artery stenosis.  3. 2.8 cm gallstone   Echocardiogram 07/09/2020: Normal LV systolic function with visual EF 60-65%. Left ventricle cavity is normal in size. Mild left ventricular hypertrophy. Normal global wall motion. Normal diastolic filling pattern, normal LAP. Mild tricuspid regurgitation. No evidence of pulmonary hypertension. No prior study for comparison.  CT  angiogram of the abdomen with contrast 07/01/2020: The CT angiogram is positive for subtle findings of fibromuscular  dysplasia of the bilateral renal arteries. This includes what  appears to be a 50% narrowing at the right renal artery origin  without significant atherosclerotic change.   Mild aortic atherosclerosis.  Aortic Atherosclerosis (ICD10-I70.0).   Coronary calcium score  07/10/2020: Coronary calcium score of 0, ascending and descending aortic measurements arenormal, Noncardiac findings include right middle lobe 2 to 3 mm nodule  most likely asubpleural lymph nodes.  If high risk consider noncontrast CT in 12 months.  EKG:   EKG 06/11/2020: Sinus rhythm with short PR interval, PR interval 110 ms.  Normal axis, no evidence of ischemia, otherwise normal EKG.     Assessment     ICD-10-CM   1. 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 Hypercholesterolemia  E78.00 rosuvastatin (CRESTOR) 20 MG tablet    4. Family history of premature CAD - Father CAD in 65s  Z51.49        Medications Discontinued During This Encounter  Medication Reason   carvedilol (COREG) 6.25 MG tablet Side effect (s)   carvedilol (COREG) 6.25 MG tablet Side effect (s)    Meds ordered this encounter  Medications   diltiazem (CARDIZEM CD) 180 MG 24 hr capsule    Sig: Take 1 capsule (180 mg total) by mouth daily.    Dispense:  30 capsule    Refill:  2   rosuvastatin (CRESTOR) 20 MG tablet    Sig: Take 1 tablet (20 mg total) by mouth daily.    Dispense:  30 tablet    Refill:  2    Recommendations:   Angie Farrell is a 50 y.o. Caucasian female with history of hypertension, hyperlipidemia, family history of premature CAD, as well as anxiety. She was a non-smoker and nondiabetic and is late 49s and early 6s years of age.  She was referred to our office  by PCP for further cardiovascular risk stratification and management of resistant hypertension.    Patient recently about 2 years ago moved from [place], [place], she is a coding person for medical practices.  I reviewed the results of the coronary calcium score which is 0 however I also reviewed the results of the renal artery CTA showing evidence of bilateral FMD.  She continues to have uncontrolled hypertension.  She had responded well to carvedilol but however she has not been able to tolerate this.  She is back on metoprolol.  I would like to try addition of diltiazem for control of blood pressure.  After review of renal CTA, I have recommended that we proceed with renal arteriogram and possible angioplasty.  Mechanism of FMD discussed with the patient.  Extensive discussion regarding risks, complications specifically bleeding complications, perforation of the renal artery, need for urgent surgical intervention of <1% discussed.  Patient is willing to proceed.  In view of FMD of the renal arteries, could consider carotid artery duplex to evaluate for FMD although no therapy exist for now in an asymptomatic individual (TIA).  Although coronary calcium score is 0, CT scan reveals abdominal aortic atherosclerosis, and in view of family history and markedly elevated LDL, I have started her on Crestor 20 mg daily.  She will need lipid profile testing in 6-8 for follow-up.  This is a 40-minute encounter in review of external records, discussions regarding hyperlipidemia and discussions regarding renal angiography.   Adrian Prows, MD, [facility] 07/23/2020, 6:23 PM Office: [phone] Fax: [phone] Pager: [phone]   CC: Dr. Gean Quint (nephrology); Ms. Windell Hummingbird, PA-C

## 2020-07-23 NOTE — Progress Notes (Signed)
Primary Physician/Referring:  Mancel Bale, PA-C  Patient ID: Lum Keas, female    DOB: 1970/10/17, 50 y.o.   MRN: [id number]  Chief Complaint  Patient presents with   RESISTANT HYPERTENSION   Hyperlipidemia    4 WEEKS   HPI:    Vear Staton  is a 50 y.o. Caucasian female with history of hypertension, hyperlipidemia, family history of premature CAD, as well as anxiety. She was a non-smoker and nondiabetic and is late 50s and early 51s years of age.  She was referred to our office by PCP for further cardiovascular risk stratification and management of resistant hypertension.    Patient recently about 2 years ago moved from [place], [place], she is a coding person for medical practices.  Patient is very sensitive to chlorthalidone causing hypotension and also carvedilol causing hypotension as well however I had tried her on low-dose of carvedilol on a prior office visit 6 weeks ago.  Stated that she did not feel well for chest pain and dizziness and hence discontinued this and is back on metoprolol.  Otherwise no specific complaints today, continues to have elevated blood pressure.     Past Medical History:  Diagnosis Date   Allergy    Ectopic cardiac rhythm    Essential hypertension 06/14/2017   Family history of premature CAD 06/14/2017   H/O diverticulitis of colon    History of panic attacks 06/14/2017   Kidney stones    X 8   Perennial allergic rhinitis 06/14/2017   Past Surgical History:  Procedure Laterality Date   LITHOTRIPSY     X 3   TONSILLECTOMY  1981   Family History  Problem Relation Age of Onset   Hypertension Mother    Heart disease Father 11   Hypertension Father    Tuberculosis Father    Heart attack Father    Hypertension Brother    Healthy Daughter    Drug abuse Son        heroine   Lung cancer Maternal Grandmother    Heart disease Maternal Grandmother 43   Heart attack Maternal Grandmother    Hearing loss Sister    Hyperlipidemia Sister     Hypertension Sister     Social History   Tobacco Use   Smoking status: Never   Smokeless tobacco: Never  Substance Use Topics   Alcohol use: Yes    Comment: social   Marital Status: Married   ROS  Review of Systems  Cardiovascular:  Negative for chest pain, dyspnea on exertion and leg swelling.  Gastrointestinal:  Negative for melena.   Objective  Blood pressure (!) 182/98, pulse (!) 59, temperature 98.4 F (36.9 C), temperature source Temporal, resp. rate 16, height _0  (1.626 m), weight 132 lb 12.8 oz (60.2 kg), SpO2 100 %.  Vitals with BMI 07/23/2020 07/23/2020 06/11/2020  Height - _1  -  Weight - 132 lbs 13 oz -  BMI - 95.62 -  Systolic 130 865 784  Diastolic 98 94 696  Pulse 59 66 -      Physical Exam Constitutional:      Appearance: She is normal weight.  Neck:     Vascular: No carotid bruit or JVD.  Cardiovascular:     Rate and Rhythm: Normal rate and regular rhythm.     Pulses: Intact distal pulses.     Heart sounds: Normal heart sounds. No murmur heard.   No gallop.  Pulmonary:     Effort: Pulmonary effort is normal.  Breath sounds: Normal breath sounds.  Abdominal:     General: Bowel sounds are normal.     Palpations: Abdomen is soft.  Musculoskeletal:        General: No swelling.   Laboratory examination:   Lipid Panel No results for input(s): CHOL, TRIG, LDLCALC, VLDL, HDL, CHOLHDL, LDLDIRECT in the last 8760 hours.  HEMOGLOBIN A1C No results found for: HGBA1C, MPG  External labs:    04/08/2020: Total cholesterol 224, triglycerides 136, HDL 35, LDL 164  TSH 1.37, free T4 0.96  Glucose 87, BUN 12, creatinine 0.85, GFR 83, sodium 139, potassium 4.2, alk phos 100, AST 14, ALT 11  Hemoglobin 13.9, hematocrit 42.1, MCV 94, platelet 273  Urine analysis with reflex normal without proteinuria.  10/08/2016: A1c 5.5%   Medications and allergies  No Known Allergies   Outpatient Medications Prior to Visit  Medication Sig Dispense Refill    ALPRAZolam (XANAX) 0.5 MG tablet Take 0.5 mg by mouth daily as needed.     cloNIDine (CATAPRES) 0.1 MG tablet Take 0.1 mg by mouth 2 (two) times daily as needed (SBP > 190 mm Hg).     metoprolol succinate (TOPROL-XL) 50 MG 24 hr tablet Take 50 mg by mouth daily.     olmesartan (BENICAR) 20 MG tablet Take 20 mg by mouth daily.     spironolactone (ALDACTONE) 50 MG tablet Take 1 tablet (50 mg total) by mouth daily. 90 tablet 3   carvedilol (COREG) 6.25 MG tablet Take 1 tablet (6.25 mg total) by mouth 2 (two) times daily. 60 tablet 2   No facility-administered medications prior to visit.   Medication as of today:  Current Meds  Medication Sig   ALPRAZolam (XANAX) 0.5 MG tablet Take 0.5 mg by mouth daily as needed.   cloNIDine (CATAPRES) 0.1 MG tablet Take 0.1 mg by mouth 2 (two) times daily as needed (SBP > 190 mm Hg).   diltiazem (CARDIZEM CD) 180 MG 24 hr capsule Take 1 capsule (180 mg total) by mouth daily.   metoprolol succinate (TOPROL-XL) 50 MG 24 hr tablet Take 50 mg by mouth daily.   olmesartan (BENICAR) 20 MG tablet Take 20 mg by mouth daily.   rosuvastatin (CRESTOR) 20 MG tablet Take 1 tablet (20 mg total) by mouth daily.   spironolactone (ALDACTONE) 50 MG tablet Take 1 tablet (50 mg total) by mouth daily.     Radiology:   No results found.  Cardiac Studies:   Bilateral renal artery duplex 05/02/2020: 1. Elevated right renal artery peak systolic velocity with renal artery to aortic ratio measuring 2.7. Findings are suggestive of renal artery stenosis/narrowing less than 60%. 2. No evidence of any left renal artery stenosis.  3. 2.8 cm gallstone   Echocardiogram 07/09/2020: Normal LV systolic function with visual EF 60-65%. Left ventricle cavity is normal in size. Mild left ventricular hypertrophy. Normal global wall motion. Normal diastolic filling pattern, normal LAP. Mild tricuspid regurgitation. No evidence of pulmonary hypertension. No prior study for comparison.  CT  angiogram of the abdomen with contrast 07/01/2020: The CT angiogram is positive for subtle findings of fibromuscular  dysplasia of the bilateral renal arteries. This includes what  appears to be a 50% narrowing at the right renal artery origin  without significant atherosclerotic change.   Mild aortic atherosclerosis.  Aortic Atherosclerosis (ICD10-I70.0).   Coronary calcium score  07/10/2020: Coronary calcium score of 0, ascending and descending aortic measurements arenormal, Noncardiac findings include right middle lobe 2 to 3 mm nodule  most likely asubpleural lymph nodes.  If high risk consider noncontrast CT in 12 months.  EKG:   EKG 06/11/2020: Sinus rhythm with short PR interval, PR interval 110 ms.  Normal axis, no evidence of ischemia, otherwise normal EKG.     Assessment     ICD-10-CM   1. 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 Hypercholesterolemia  E78.00 rosuvastatin (CRESTOR) 20 MG tablet    4. Family history of premature CAD - Father CAD in 65s  Z51.49        Medications Discontinued During This Encounter  Medication Reason   carvedilol (COREG) 6.25 MG tablet Side effect (s)   carvedilol (COREG) 6.25 MG tablet Side effect (s)    Meds ordered this encounter  Medications   diltiazem (CARDIZEM CD) 180 MG 24 hr capsule    Sig: Take 1 capsule (180 mg total) by mouth daily.    Dispense:  30 capsule    Refill:  2   rosuvastatin (CRESTOR) 20 MG tablet    Sig: Take 1 tablet (20 mg total) by mouth daily.    Dispense:  30 tablet    Refill:  2    Recommendations:   Capitola Ladson is a 50 y.o. Caucasian female with history of hypertension, hyperlipidemia, family history of premature CAD, as well as anxiety. She was a non-smoker and nondiabetic and is late 49s and early 6s years of age.  She was referred to our office  by PCP for further cardiovascular risk stratification and management of resistant hypertension.    Patient recently about 2 years ago moved from [place], [place], she is a coding person for medical practices.  I reviewed the results of the coronary calcium score which is 0 however I also reviewed the results of the renal artery CTA showing evidence of bilateral FMD.  She continues to have uncontrolled hypertension.  She had responded well to carvedilol but however she has not been able to tolerate this.  She is back on metoprolol.  I would like to try addition of diltiazem for control of blood pressure.  After review of renal CTA, I have recommended that we proceed with renal arteriogram and possible angioplasty.  Mechanism of FMD discussed with the patient.  Extensive discussion regarding risks, complications specifically bleeding complications, perforation of the renal artery, need for urgent surgical intervention of <1% discussed.  Patient is willing to proceed.  In view of FMD of the renal arteries, could consider carotid artery duplex to evaluate for FMD although no therapy exist for now in an asymptomatic individual (TIA).  Although coronary calcium score is 0, CT scan reveals abdominal aortic atherosclerosis, and in view of family history and markedly elevated LDL, I have started her on Crestor 20 mg daily.  She will need lipid profile testing in 6-8 for follow-up.  This is a 40-minute encounter in review of external records, discussions regarding hyperlipidemia and discussions regarding renal angiography.   Adrian Prows, MD, [facility] 07/23/2020, 6:23 PM Office: [phone] Fax: [phone] Pager: [phone]   CC: Dr. Gean Quint (nephrology); Ms. Windell Hummingbird, PA-C

## 2020-07-24 NOTE — Telephone Encounter (Signed)
From patient.

## 2020-07-31 ENCOUNTER — Other Ambulatory Visit: Payer: Self-pay | Admitting: Nephrology

## 2020-07-31 DIAGNOSIS — R519 Headache, unspecified: Secondary | ICD-10-CM

## 2020-07-31 DIAGNOSIS — G8929 Other chronic pain: Secondary | ICD-10-CM

## 2020-08-14 LAB — CBC
Hematocrit: 41 % (ref 34.0–46.6)
Hemoglobin: 13.5 g/dL (ref 11.1–15.9)
MCH: 32.2 pg (ref 26.6–33.0)
MCHC: 32.9 g/dL (ref 31.5–35.7)
MCV: 98 fL — ABNORMAL HIGH (ref 79–97)
Platelets: 247 10*3/uL (ref 150–450)
RBC: 4.19 x10E6/uL (ref 3.77–5.28)
RDW: 11.8 % (ref 11.7–15.4)
WBC: 7.8 10*3/uL (ref 3.4–10.8)

## 2020-08-14 LAB — BASIC METABOLIC PANEL
BUN/Creatinine Ratio: 13 (ref 9–23)
BUN: 12 mg/dL (ref 6–24)
CO2: 22 mmol/L (ref 20–29)
Calcium: 9.4 mg/dL (ref 8.7–10.2)
Chloride: 103 mmol/L (ref 96–106)
Creatinine, Ser: 0.95 mg/dL (ref 0.57–1.00)
Glucose: 83 mg/dL (ref 65–99)
Potassium: 4.2 mmol/L (ref 3.5–5.2)
Sodium: 141 mmol/L (ref 134–144)
eGFR: 73 mL/min/{1.73_m2} (ref >59)

## 2020-08-18 DIAGNOSIS — I701 Atherosclerosis of renal artery: Secondary | ICD-10-CM | POA: Diagnosis present

## 2020-08-19 ENCOUNTER — Encounter (HOSPITAL_COMMUNITY)
Admission: RE | Disposition: A | Discharge status: Home / Self Care | Payer: Self-pay | Source: Home / Self Care | Attending: Cardiology

## 2020-08-19 ENCOUNTER — Ambulatory Visit (HOSPITAL_COMMUNITY)
Admission: RE | Admit: 2020-08-19 | Discharge: 2020-08-19 | Disposition: A | Discharge status: Home / Self Care | Payer: Managed Care, Other (non HMO) | Attending: Cardiology | Admitting: Cardiology

## 2020-08-19 ENCOUNTER — Other Ambulatory Visit (HOSPITAL_COMMUNITY): Payer: Self-pay

## 2020-08-19 ENCOUNTER — Encounter (HOSPITAL_COMMUNITY): Payer: Self-pay | Admitting: Cardiology

## 2020-08-19 ENCOUNTER — Other Ambulatory Visit: Payer: Self-pay

## 2020-08-19 DIAGNOSIS — E7800 Pure hypercholesterolemia, unspecified: Secondary | ICD-10-CM | POA: Diagnosis not present

## 2020-08-19 DIAGNOSIS — I701 Atherosclerosis of renal artery: Secondary | ICD-10-CM | POA: Diagnosis not present

## 2020-08-19 DIAGNOSIS — Z8249 Family history of ischemic heart disease and other diseases of the circulatory system: Secondary | ICD-10-CM | POA: Insufficient documentation

## 2020-08-19 DIAGNOSIS — Z8719 Personal history of other diseases of the digestive system: Secondary | ICD-10-CM | POA: Insufficient documentation

## 2020-08-19 DIAGNOSIS — G7102 Facioscapulohumeral muscular dystrophy: Secondary | ICD-10-CM

## 2020-08-19 DIAGNOSIS — Z79899 Other long term (current) drug therapy: Secondary | ICD-10-CM | POA: Diagnosis not present

## 2020-08-19 DIAGNOSIS — I150 Renovascular hypertension: Secondary | ICD-10-CM | POA: Insufficient documentation

## 2020-08-19 DIAGNOSIS — E785 Hyperlipidemia, unspecified: Secondary | ICD-10-CM | POA: Diagnosis not present

## 2020-08-19 HISTORY — PX: ABDOMINAL AORTOGRAM: CATH118222

## 2020-08-19 HISTORY — PX: RENAL ANGIOGRAPHY: CATH118260

## 2020-08-19 SURGERY — ABDOMINAL AORTOGRAM
Anesthesia: LOCAL

## 2020-08-19 MED ORDER — ONDANSETRON HCL 4 MG/2ML IJ SOLN: 4.0000 mg | Freq: Four times a day (QID) | INTRAMUSCULAR | Status: DC | PRN

## 2020-08-19 MED ORDER — LIDOCAINE HCL (PF) 1 % IJ SOLN
INTRAMUSCULAR | Status: AC
  Filled 2020-08-19: qty 30

## 2020-08-19 MED ORDER — SODIUM CHLORIDE 0.9% FLUSH: 3.0000 mL | INTRAVENOUS | Status: DC | PRN

## 2020-08-19 MED ORDER — SODIUM CHLORIDE 0.9 % IV SOLN: 250.0000 mL | INTRAVENOUS | Status: DC | PRN

## 2020-08-19 MED ORDER — SODIUM CHLORIDE 0.9 % WEIGHT BASED INFUSION: 1.0000 mL/kg/h | INTRAVENOUS | Status: DC

## 2020-08-19 MED ORDER — ACETAMINOPHEN 325 MG PO TABS
650.0000 mg | ORAL_TABLET | ORAL | Status: DC | PRN
  Administered 2020-08-19: 650 mg via ORAL

## 2020-08-19 MED ORDER — IODIXANOL 320 MG/ML IV SOLN
INTRAVENOUS | Status: DC | PRN
  Administered 2020-08-19: 42 mL via INTRA_ARTERIAL

## 2020-08-19 MED ORDER — LIDOCAINE HCL (PF) 1 % IJ SOLN
INTRAMUSCULAR | Status: DC | PRN
  Administered 2020-08-19: 15 mL via INTRADERMAL

## 2020-08-19 MED ORDER — HEPARIN (PORCINE) IN NACL 1000-0.9 UT/500ML-% IV SOLN
INTRAVENOUS | Status: AC
  Filled 2020-08-19: qty 1000

## 2020-08-19 MED ORDER — MIDAZOLAM HCL 2 MG/2ML IJ SOLN
INTRAMUSCULAR | Status: AC
  Filled 2020-08-19: qty 2

## 2020-08-19 MED ORDER — FENTANYL CITRATE (PF) 100 MCG/2ML IJ SOLN
INTRAMUSCULAR | Status: DC | PRN
  Administered 2020-08-19: 25 ug via INTRAVENOUS

## 2020-08-19 MED ORDER — SODIUM CHLORIDE 0.9 % WEIGHT BASED INFUSION
3.0000 mL/kg/h | INTRAVENOUS | Status: AC
  Administered 2020-08-19: 3 mL/kg/h via INTRAVENOUS

## 2020-08-19 MED ORDER — SODIUM CHLORIDE 0.9% FLUSH: 3.0000 mL | Freq: Two times a day (BID) | INTRAVENOUS | Status: DC

## 2020-08-19 MED ORDER — FENTANYL CITRATE (PF) 100 MCG/2ML IJ SOLN
INTRAMUSCULAR | Status: AC
  Filled 2020-08-19: qty 2

## 2020-08-19 MED ORDER — HEPARIN (PORCINE) IN NACL 1000-0.9 UT/500ML-% IV SOLN
INTRAVENOUS | Status: DC | PRN
  Administered 2020-08-19 (×2): 500 mL

## 2020-08-19 MED ORDER — SODIUM CHLORIDE 0.9 % WEIGHT BASED INFUSION: 1.0000 mL/kg/h | INTRAVENOUS | Status: AC

## 2020-08-19 MED ORDER — MIDAZOLAM HCL 2 MG/2ML IJ SOLN
INTRAMUSCULAR | Status: DC | PRN
  Administered 2020-08-19: 2 mg via INTRAVENOUS

## 2020-08-19 MED ORDER — ACETAMINOPHEN 325 MG PO TABS
ORAL_TABLET | ORAL | Status: AC
  Filled 2020-08-19: qty 2

## 2020-08-19 MED ORDER — LABETALOL HCL 5 MG/ML IV SOLN
10.0000 mg | INTRAVENOUS | Status: DC | PRN
Start: 2020-08-19 — End: 2020-08-19

## 2020-08-19 MED ORDER — LABETALOL HCL 100 MG PO TABS
100.0000 mg | ORAL_TABLET | Freq: Two times a day (BID) | ORAL | 1 refills | Status: DC
  Filled 2020-08-19: qty 60, 30d supply, fill #0

## 2020-08-19 SURGICAL SUPPLY — 12 items
CATH ANGIO 5F PIGTAIL 65CM (CATHETERS) ×2 IMPLANT
CATH CROSS OVER TEMPO 5F (CATHETERS) ×2 IMPLANT
KIT MICROPUNCTURE NIT STIFF (SHEATH) ×2 IMPLANT
KIT PV (KITS) ×2 IMPLANT
SHEATH PINNACLE 5F 10CM (SHEATH) ×2 IMPLANT
SHEATH PROBE COVER 6X72 (BAG) ×2 IMPLANT
STOPCOCK MORSE 400PSI 3WAY (MISCELLANEOUS) ×2 IMPLANT
SYR MEDRAD MARK 7 150ML (SYRINGE) ×2 IMPLANT
TRANSDUCER W/STOPCOCK (MISCELLANEOUS) ×2 IMPLANT
TRAY PV CATH (CUSTOM PROCEDURE TRAY) ×2 IMPLANT
TUBING CIL FLEX 10 FLL-RA (TUBING) ×2 IMPLANT
WIRE HITORQ VERSACORE ST 145CM (WIRE) ×2 IMPLANT

## 2020-08-19 NOTE — Interval H&P Note (Signed)
History and Physical Interval Note:  08/19/2020 7:39 AM  Angie Farrell  has presented today for surgery, with the diagnosis of hp.  The various methods of treatment have been discussed with the patient and family. After consideration of risks, benefits and other options for treatment, the patient has consented to  Procedure(s): RENAL ANGIOGRAPHY (N/A) and possible angioplasty as a surgical intervention.  The patient's history has been reviewed, patient examined, no change in status, stable for surgery.  I have reviewed the patient's chart and labs.  Questions were answered to the patient's satisfaction.    Adrian Prows, MD, [facility] 08/19/2020, 7:39 AM Office: [phone] Fax: [phone] Pager: [phone]

## 2020-08-19 NOTE — Progress Notes (Signed)
Pt ambulated without difficulty or bleeding.   Discharged home with her husband who will drive and stay with pt x 24 hrs. 

## 2020-08-19 NOTE — Progress Notes (Signed)
Site area: right groin a 5 french arterial sheath was removed  Site Prior to Removal:  Level 0  Pressure Applied For 20 MINUTES    Bedrest Beginning at 0855am X 4 hours  Manual:   Yes.    Patient Status During Pull:  stable  Post Pull Groin Site:  Level 0  Post Pull Instructions Given:  Yes.    Post Pull Pulses Present:  Yes.    Dressing Applied:  Yes.    Comments:

## 2020-08-22 ENCOUNTER — Encounter (HOSPITAL_COMMUNITY): Payer: Self-pay | Admitting: Cardiology

## 2020-08-28 ENCOUNTER — Other Ambulatory Visit: Payer: Self-pay

## 2020-08-28 ENCOUNTER — Ambulatory Visit
Admission: RE | Admit: 2020-08-28 | Discharge: 2020-08-28 | Disposition: A | Discharge status: Home / Self Care | Payer: Managed Care, Other (non HMO) | Source: Ambulatory Visit | Attending: Nephrology | Admitting: Nephrology

## 2020-08-28 ENCOUNTER — Other Ambulatory Visit: Payer: Self-pay | Admitting: Nephrology

## 2020-08-28 DIAGNOSIS — G8929 Other chronic pain: Secondary | ICD-10-CM

## 2020-08-28 DIAGNOSIS — R519 Headache, unspecified: Secondary | ICD-10-CM

## 2020-08-28 MED ORDER — IOPAMIDOL (ISOVUE-370) INJECTION 76%
75.0000 mL | Freq: Once | INTRAVENOUS | Status: AC | PRN
  Administered 2020-08-28: 75 mL via INTRAVENOUS

## 2020-09-01 ENCOUNTER — Ambulatory Visit: Payer: Managed Care, Other (non HMO) | Admitting: Cardiology

## 2020-09-01 ENCOUNTER — Encounter: Payer: Self-pay | Admitting: Cardiology

## 2020-09-01 ENCOUNTER — Other Ambulatory Visit: Payer: Self-pay

## 2020-09-01 VITALS — BP 185/100 | HR 76 | Temp 98.6°F | Resp 16 | Ht 64.0 in | Wt 131.8 lb

## 2020-09-01 DIAGNOSIS — E7800 Pure hypercholesterolemia, unspecified: Secondary | ICD-10-CM

## 2020-09-01 DIAGNOSIS — I700 Atherosclerosis of aorta: Secondary | ICD-10-CM

## 2020-09-01 DIAGNOSIS — Z8249 Family history of ischemic heart disease and other diseases of the circulatory system: Secondary | ICD-10-CM

## 2020-09-01 DIAGNOSIS — I10 Essential (primary) hypertension: Secondary | ICD-10-CM

## 2020-09-01 DIAGNOSIS — I773 Arterial fibromuscular dysplasia: Secondary | ICD-10-CM

## 2020-09-01 DIAGNOSIS — I701 Atherosclerosis of renal artery: Secondary | ICD-10-CM

## 2020-09-01 MED ORDER — ASPIRIN 81 MG PO CHEW: 81.0000 mg | CHEWABLE_TABLET | Freq: Every day | ORAL | Status: DC

## 2020-09-01 MED ORDER — LABETALOL HCL 200 MG PO TABS: 200.0000 mg | ORAL_TABLET | Freq: Three times a day (TID) | ORAL | 2 refills | Status: DC

## 2020-09-01 MED ORDER — HYDRALAZINE HCL 50 MG PO TABS: 50.0000 mg | ORAL_TABLET | Freq: Four times a day (QID) | ORAL | 2 refills | Status: DC | PRN

## 2020-09-01 NOTE — Progress Notes (Signed)
 Primary Physician/Referring:  Weber, Sarah L, PA-C  Patient ID: Angie Farrell, female    DOB: 08/02/1970, 50 y.o.   MRN: [id number]  Chief Complaint  Patient presents with   Hypertension   HPI:    Angie Farrell  is a 50 y.o. Caucasian female with history of hypertension, hyperlipidemia, family history of premature CAD, as well as anxiety. She was a non-smoker and nondiabetic and is late 50s and early 60s years of age.  She was referred to our office by PCP for further cardiovascular risk stratification and management of resistant hypertension.    Patient 2 years ago moved from [place], [place], she is a coding person for medical practices. She underwent renal arteriogram on 08/19/2020 and has not had any periprocedural complications.  She also underwent CT of the head and neck for evaluation of FMD ordered by Dr. Singh, nephrology.  She now presents for follow-up.  Has noticed since changing metoprolol to labetalol blood pressures improved.  No chest pain or dyspnea.  Patient is very sensitive to chlorthalidone causing hypotension and also carvedilol causing hypotension as well in the past. Otherwise no specific complaints today, continues to have elevated blood pressure.     Past Medical History:  Diagnosis Date   Allergy    Ectopic cardiac rhythm    Essential hypertension 06/14/2017   Family history of premature CAD 06/14/2017   H/O diverticulitis of colon    History of panic attacks 06/14/2017   Kidney stones    X 8   Perennial allergic rhinitis 06/14/2017   Past Surgical History:  Procedure Laterality Date   ABDOMINAL AORTOGRAM N/A 08/19/2020   Procedure: ABDOMINAL AORTOGRAM;  Surgeon: Ganji, Jay, MD;  Location: [facility];  Service: Cardiovascular;  Laterality: N/A;   LITHOTRIPSY     X 3   RENAL ANGIOGRAPHY N/A 08/19/2020   Procedure: RENAL ANGIOGRAPHY;  Surgeon: Ganji, Jay, MD;  Location: [facility] CV [facility];  Service: Cardiovascular;  Laterality: N/A;   TONSILLECTOMY  1981    Family History  Problem Relation Age of Onset   Hypertension Mother    Heart disease Father 45   Hypertension Father    Tuberculosis Father    Heart attack Father    Hypertension Brother    Healthy Daughter    Drug abuse Son        heroine   Lung cancer Maternal Grandmother    Heart disease Maternal Grandmother 45   Heart attack Maternal Grandmother    Hearing loss Sister    Hyperlipidemia Sister    Hypertension Sister     Social History   Tobacco Use   Smoking status: Never   Smokeless tobacco: Never  Substance Use Topics   Alcohol use: Yes    Comment: social   Marital Status: Married   ROS  Review of Systems  Cardiovascular:  Negative for chest pain, dyspnea on exertion and leg swelling.  Gastrointestinal:  Negative for melena.   Objective  Blood pressure (!) 185/100, pulse 76, temperature 98.6 F (37 C), temperature source Temporal, resp. rate 16, height 5' 4" (1.626 m), weight 131 lb 12.8 oz (59.8 kg), SpO2 98 %.  Vitals with BMI 09/01/2020 08/19/2020 08/19/2020  Height 5' 4" - -  Weight 131 lbs 13 oz - -  BMI 22.61 - -  Systolic 185 156 150  Diastolic 100 85 80  Pulse 76 57 60      Physical Exam Constitutional:      Appearance: She   is normal weight.  Neck:     Vascular: No carotid bruit or JVD.  Cardiovascular:     Rate and Rhythm: Normal rate and regular rhythm.     Pulses: Intact distal pulses.     Heart sounds: Normal heart sounds. No murmur heard.   No gallop.  Pulmonary:     Effort: Pulmonary effort is normal.     Breath sounds: Normal breath sounds.  Abdominal:     General: Bowel sounds are normal.     Palpations: Abdomen is soft.  Musculoskeletal:        General: No swelling.   Laboratory examination:   Lipid Panel No results for input(s): CHOL, TRIG, LDLCALC, VLDL, HDL, CHOLHDL, LDLDIRECT in the last 8760 hours.  HEMOGLOBIN A1C No results found for: HGBA1C, MPG  External labs:   04/08/2020: Total cholesterol 224, triglycerides  136, HDL 35, LDL 164  TSH 1.37, free T4 0.96  Glucose 87, BUN 12, creatinine 0.85, GFR 83, sodium 139, potassium 4.2, alk phos 100, AST 14, ALT 11  Hemoglobin 13.9, hematocrit 42.1, MCV 94, platelet 273  Urine analysis with reflex normal without proteinuria.  10/08/2016: A1c 5.5%   Medications and allergies  No Known Allergies   Outpatient Medications Prior to Visit  Medication Sig Dispense Refill   ALPRAZolam (XANAX) 0.5 MG tablet Take 0.5 mg by mouth daily as needed for anxiety.     cloNIDine (CATAPRES) 0.1 MG tablet Take 0.1 mg by mouth daily as needed (SBP > 190 mm Hg).     diltiazem (CARDIZEM CD) 180 MG 24 hr capsule Take 1 capsule (180 mg total) by mouth daily. 30 capsule 2   rosuvastatin (CRESTOR) 20 MG tablet Take 1 tablet (20 mg total) by mouth daily. 30 tablet 2   spironolactone (ALDACTONE) 50 MG tablet Take 1 tablet (50 mg total) by mouth daily. 90 tablet 3   labetalol (NORMODYNE) 100 MG tablet Take 1 tablet (100 mg total) by mouth 2 (two) times daily. 60 tablet 1   acetaminophen (TYLENOL) 500 MG tablet Take 1,000 mg by mouth every 6 (six) hours as needed for moderate pain or headache.     No facility-administered medications prior to visit.   Medication as of today:  Current Meds  Medication Sig   ALPRAZolam (XANAX) 0.5 MG tablet Take 0.5 mg by mouth daily as needed for anxiety.   aspirin (ASPIRIN CHILDRENS) 81 MG chewable tablet Chew 1 tablet (81 mg total) by mouth daily.   cloNIDine (CATAPRES) 0.1 MG tablet Take 0.1 mg by mouth daily as needed (SBP > 190 mm Hg).   diltiazem (CARDIZEM CD) 180 MG 24 hr capsule Take 1 capsule (180 mg total) by mouth daily.   hydrALAZINE (APRESOLINE) 50 MG tablet Take 1 tablet (50 mg total) by mouth 4 (four) times daily as needed (BP >160/100).   rosuvastatin (CRESTOR) 20 MG tablet Take 1 tablet (20 mg total) by mouth daily.   spironolactone (ALDACTONE) 50 MG tablet Take 1 tablet (50 mg total) by mouth daily.   [DISCONTINUED] labetalol  (NORMODYNE) 100 MG tablet Take 1 tablet (100 mg total) by mouth 2 (two) times daily.     Radiology:   CT angiogram of the head and neck 08/28/2020: 1. 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 A focal fenestration is a less likely differential consideration. 2. Severe right vertebral artery   origin stenosis. 3. Small left carotid web. This finding has been reported to increase the risk of ischemic stroke. 4. No evidence of acute intracranial abnormality.  Cardiac Studies:   Bilateral renal artery duplex 05/02/2020: 1. Elevated right renal artery peak systolic velocity with renal artery to aortic ratio measuring 2.7. Findings are suggestive of renal artery stenosis/narrowing less than 60%. 2. No evidence of any left renal artery stenosis.  3. 2.8 cm gallstone   Echocardiogram 07/09/2020: Normal LV systolic function with visual EF 60-65%. Left ventricle cavity is normal in size. Mild left ventricular hypertrophy. Normal global wall motion. Normal diastolic filling pattern, normal LAP. Mild tricuspid regurgitation. No evidence of pulmonary hypertension. No prior study for comparison.  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 This includes what  appears to be a 50% narrowing at the right renal artery origin  without significant atherosclerotic change.   Mild aortic atherosclerosis.  Aortic Atherosclerosis (ICD10-I70.0).   Coronary calcium score  07/10/2020: Coronary calcium score of 0, ascending and descending aortic measurements arenormal, Noncardiac findings include right middle lobe 2 to 3 mm nodule most likely asubpleural lymph nodes.  If high risk consider noncontrast CT in 12 months.  Renal arteriogram  08/19/2020: Right renal artery ostium has a 30% stenosis with a 10 mmHg pressure gradient.  There is appearance of beaded FMD in the right distal renal artery.  There is accessory inferior pole renal artery which is widely patent. Left renal artery has very mild beaded appearance again in the distal segment.  Left inferior pole accessory renal arteries widely patent.  Recommendation: Patient's FMD appears to be much more distal especially on the right is more prominent than the left, in view of distal renal artery involvement, potential for dissection and renal loss is high.  Potential could be to use a scoring balloon or a short chocolate balloon, not available in the hospital presently.  I will change her metoprolol to labetalol and see her back in the office and discuss options.  EKG:   EKG 06/11/2020: Sinus rhythm with short PR interval, PR interval 110 ms.  Normal axis, no evidence of ischemia, otherwise normal EKG.     Assessment     ICD-10-CM   1. Resistant hypertension  I10 labetalol (NORMODYNE) 200 MG tablet    Basic metabolic panel    hydrALAZINE (APRESOLINE) 50 MG tablet    2. 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 Hypercholesterolemia  E78.00 Lipid Panel With LDL/HDL Ratio    Lipoprotein A (LPA)    4. Family history of premature CAD - Father CAD in 3s  Z55.49     75. Aortic atherosclerosis (HCC)  I70.0 aspirin (ASPIRIN CHILDRENS) 81 MG chewable tablet    6. 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    Dispense:  90 tablet     Refill:  2   hydrALAZINE (APRESOLINE) 50 MG tablet  Sig: Take 1 tablet (50 mg total) by mouth 4 (four) times daily as needed (BP >160/100).    Dispense:  120 tablet    Refill:  2   aspirin (ASPIRIN CHILDRENS) 81 MG chewable tablet    Sig: Chew 1 tablet (81 mg total) by mouth daily.     Recommendations:   Angie Farrell is a 50 y.o. Caucasian female with history of hypertension, hyperlipidemia, family history of premature CAD, as well as anxiety. She was a non-smoker and nondiabetic and is late 44s and early 96s years of age.  She was referred to our office by PCP for further cardiovascular risk stratification and management of resistant hypertension.    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 She underwent renal arteriogram on 08/19/2020 and has not had any periprocedural complications.  She also underwent CT of the head and neck for evaluation of FMD ordered by Dr. Candiss Norse, nephrology.  She now presents for follow-up.  Has noticed since changing metoprolol to labetalol blood pressures improved.  No chest pain or dyspnea.  Patient states that since addition of labetalol, blood pressure has been relatively well controlled.  Although the blood pressure was high today, she will continue to monitor this closely, I will increase the labetalol from 100 mg twice daily to 200 mg 3 times daily and added hydralazine to be taken on a as needed basis as well although she is also taking clonidine on a as needed basis.  I reviewed the results of the renal arteriogram and extension of the FMD all the way very close to the pelvis of the renal artery and the risks of dissection and loss of the knee discussed with the patient.  She understands the risks associated with balloon angioplasty, I would like to try to set her up for repeat renal angiography and angioplasty and will try to use either a cutting balloon or chocolate balloon that is appropriately sized.  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  In view of the atherosclerotic changes noted in the abdominal aorta, FMD, vertebral stenosis, I have also recommended that she take 81 aspirin 81 mg daily.  I will see her back after renal angiography and angioplasty.  She does have family history of premature coronary disease, lipids are elevated, I had started her on Crestor which she is tolerating, will check her lipids as well.   Adrian Prows, MD, [facility] 09/01/2020, 11:58 AM Office: [phone] Fax: [phone] Pager: [phone]   CC: Dr. Gean Quint (nephrology); Ms. Windell Hummingbird, PA-C

## 2020-09-01 NOTE — Telephone Encounter (Signed)
From patient.

## 2020-10-02 ENCOUNTER — Other Ambulatory Visit (HOSPITAL_COMMUNITY): Payer: Self-pay | Admitting: Interventional Radiology

## 2020-10-02 DIAGNOSIS — I6509 Occlusion and stenosis of unspecified vertebral artery: Secondary | ICD-10-CM

## 2020-10-03 ENCOUNTER — Ambulatory Visit (HOSPITAL_COMMUNITY)
Admission: RE | Admit: 2020-10-03 | Discharge: 2020-10-03 | Disposition: A | Discharge status: Home / Self Care | Payer: Managed Care, Other (non HMO) | Source: Ambulatory Visit | Attending: Interventional Radiology | Admitting: Interventional Radiology

## 2020-10-03 ENCOUNTER — Other Ambulatory Visit: Payer: Self-pay

## 2020-10-03 DIAGNOSIS — I6509 Occlusion and stenosis of unspecified vertebral artery: Secondary | ICD-10-CM

## 2020-10-03 LAB — BASIC METABOLIC PANEL
BUN/Creatinine Ratio: 11 (ref 9–23)
BUN: 10 mg/dL (ref 6–24)
CO2: 22 mmol/L (ref 20–29)
Calcium: 9.1 mg/dL (ref 8.7–10.2)
Chloride: 103 mmol/L (ref 96–106)
Creatinine, Ser: 0.88 mg/dL (ref 0.57–1.00)
Glucose: 91 mg/dL (ref 65–99)
Potassium: 4.6 mmol/L (ref 3.5–5.2)
Sodium: 140 mmol/L (ref 134–144)
eGFR: 80 mL/min/{1.73_m2} (ref >59)

## 2020-10-03 LAB — LIPID PANEL WITH LDL/HDL RATIO
Cholesterol, Total: 234 mg/dL — ABNORMAL HIGH (ref 100–199)
HDL: 36 mg/dL — ABNORMAL LOW (ref >39)
LDL Chol Calc (NIH): 175 mg/dL — ABNORMAL HIGH (ref 0–99)
LDL/HDL Ratio: 4.9 ratio — ABNORMAL HIGH (ref 0.0–3.2)
Triglycerides: 128 mg/dL (ref 0–149)
VLDL Cholesterol Cal: 23 mg/dL (ref 5–40)

## 2020-10-03 LAB — LIPOPROTEIN A (LPA): Lipoprotein (a): 94.4 nmol/L — ABNORMAL HIGH (ref <75.0)

## 2020-10-03 NOTE — Progress Notes (Signed)
Labs are markedly abnormal with markedly elevated LDL and LPA is mildly elevated at 94, we discussed during office visit so.  Will forward a copy to PCP.

## 2020-10-06 HISTORY — PX: IR RADIOLOGIST EVAL & MGMT: IMG5224

## 2020-10-07 ENCOUNTER — Encounter (HOSPITAL_COMMUNITY)
Admission: RE | Disposition: A | Discharge status: Home / Self Care | Payer: Self-pay | Source: Ambulatory Visit | Attending: Cardiology

## 2020-10-07 ENCOUNTER — Ambulatory Visit (HOSPITAL_COMMUNITY)
Admission: RE | Admit: 2020-10-07 | Discharge: 2020-10-07 | Disposition: A | Discharge status: Home / Self Care | Payer: Managed Care, Other (non HMO) | Source: Ambulatory Visit | Attending: Cardiology | Admitting: Cardiology

## 2020-10-07 ENCOUNTER — Encounter (HOSPITAL_COMMUNITY): Payer: Self-pay | Admitting: Cardiology

## 2020-10-07 ENCOUNTER — Other Ambulatory Visit (HOSPITAL_COMMUNITY): Payer: Self-pay | Admitting: Interventional Radiology

## 2020-10-07 ENCOUNTER — Other Ambulatory Visit: Payer: Self-pay

## 2020-10-07 DIAGNOSIS — I701 Atherosclerosis of renal artery: Secondary | ICD-10-CM | POA: Diagnosis present

## 2020-10-07 DIAGNOSIS — I700 Atherosclerosis of aorta: Secondary | ICD-10-CM | POA: Diagnosis not present

## 2020-10-07 DIAGNOSIS — Z79899 Other long term (current) drug therapy: Secondary | ICD-10-CM | POA: Insufficient documentation

## 2020-10-07 DIAGNOSIS — F419 Anxiety disorder, unspecified: Secondary | ICD-10-CM | POA: Diagnosis not present

## 2020-10-07 DIAGNOSIS — E785 Hyperlipidemia, unspecified: Secondary | ICD-10-CM | POA: Insufficient documentation

## 2020-10-07 DIAGNOSIS — Z7982 Long term (current) use of aspirin: Secondary | ICD-10-CM | POA: Diagnosis not present

## 2020-10-07 DIAGNOSIS — I10 Essential (primary) hypertension: Secondary | ICD-10-CM | POA: Diagnosis not present

## 2020-10-07 DIAGNOSIS — Z8249 Family history of ischemic heart disease and other diseases of the circulatory system: Secondary | ICD-10-CM | POA: Diagnosis not present

## 2020-10-07 DIAGNOSIS — G7102 Facioscapulohumeral muscular dystrophy: Secondary | ICD-10-CM | POA: Diagnosis present

## 2020-10-07 DIAGNOSIS — I6509 Occlusion and stenosis of unspecified vertebral artery: Secondary | ICD-10-CM

## 2020-10-07 HISTORY — PX: CORONARY PRESSURE/FFR STUDY: CATH118243

## 2020-10-07 HISTORY — PX: RENAL ANGIOGRAPHY: CATH118260

## 2020-10-07 LAB — CBC
HCT: 38.3 % (ref 36.0–46.0)
Hemoglobin: 12.8 g/dL (ref 12.0–15.0)
MCH: 32.5 pg (ref 26.0–34.0)
MCHC: 33.4 g/dL (ref 30.0–36.0)
MCV: 97.2 fL (ref 80.0–100.0)
Platelets: 252 10*3/uL (ref 150–400)
RBC: 3.94 MIL/uL (ref 3.87–5.11)
RDW: 12 % (ref 11.5–15.5)
WBC: 9 10*3/uL (ref 4.0–10.5)
nRBC: 0 % (ref 0.0–0.2)

## 2020-10-07 LAB — POCT ACTIVATED CLOTTING TIME: Activated Clotting Time: 260 seconds

## 2020-10-07 SURGERY — RENAL ANGIOGRAPHY
Anesthesia: LOCAL

## 2020-10-07 MED ORDER — SODIUM CHLORIDE 0.9 % IV SOLN: INTRAVENOUS | Status: DC

## 2020-10-07 MED ORDER — HEPARIN (PORCINE) IN NACL 1000-0.9 UT/500ML-% IV SOLN
INTRAVENOUS | Status: AC
  Filled 2020-10-07: qty 1000

## 2020-10-07 MED ORDER — FENTANYL CITRATE (PF) 100 MCG/2ML IJ SOLN
INTRAMUSCULAR | Status: DC | PRN
  Administered 2020-10-07: 50 ug via INTRAVENOUS

## 2020-10-07 MED ORDER — MIDAZOLAM HCL 2 MG/2ML IJ SOLN
INTRAMUSCULAR | Status: AC
  Filled 2020-10-07: qty 2

## 2020-10-07 MED ORDER — SODIUM CHLORIDE 0.9 % IV SOLN: 250.0000 mL | INTRAVENOUS | Status: DC | PRN

## 2020-10-07 MED ORDER — LABETALOL HCL 5 MG/ML IV SOLN: 10.0000 mg | INTRAVENOUS | Status: DC | PRN

## 2020-10-07 MED ORDER — ONDANSETRON HCL 4 MG/2ML IJ SOLN
INTRAMUSCULAR | Status: AC
  Filled 2020-10-07: qty 2

## 2020-10-07 MED ORDER — SODIUM CHLORIDE 0.9% FLUSH: 3.0000 mL | Freq: Two times a day (BID) | INTRAVENOUS | Status: DC

## 2020-10-07 MED ORDER — ONDANSETRON HCL 4 MG/2ML IJ SOLN: 4.0000 mg | Freq: Four times a day (QID) | INTRAMUSCULAR | Status: DC | PRN

## 2020-10-07 MED ORDER — LIDOCAINE HCL (PF) 1 % IJ SOLN
INTRAMUSCULAR | Status: AC
  Filled 2020-10-07: qty 30

## 2020-10-07 MED ORDER — HEPARIN SODIUM (PORCINE) 1000 UNIT/ML IJ SOLN
INTRAMUSCULAR | Status: DC | PRN
  Administered 2020-10-07: 2000 [IU] via INTRAVENOUS
  Administered 2020-10-07: 6000 [IU] via INTRAVENOUS

## 2020-10-07 MED ORDER — SODIUM CHLORIDE 0.9% FLUSH: 3.0000 mL | INTRAVENOUS | Status: DC | PRN

## 2020-10-07 MED ORDER — FENTANYL CITRATE (PF) 100 MCG/2ML IJ SOLN
INTRAMUSCULAR | Status: AC
  Filled 2020-10-07: qty 2

## 2020-10-07 MED ORDER — ACETAMINOPHEN 325 MG PO TABS
650.0000 mg | ORAL_TABLET | ORAL | Status: DC | PRN
Start: 2020-10-07 — End: 2020-10-07
  Administered 2020-10-07: 650 mg via ORAL
  Filled 2020-10-07: qty 2

## 2020-10-07 MED ORDER — SODIUM CHLORIDE 0.9 % IV SOLN: INTRAVENOUS | Status: AC

## 2020-10-07 MED ORDER — MIDAZOLAM HCL 2 MG/2ML IJ SOLN
INTRAMUSCULAR | Status: DC | PRN
  Administered 2020-10-07: 2 mg via INTRAVENOUS

## 2020-10-07 MED ORDER — LIDOCAINE HCL (PF) 1 % IJ SOLN
INTRAMUSCULAR | Status: DC | PRN
  Administered 2020-10-07: 20 mL

## 2020-10-07 MED ORDER — SPIRONOLACTONE 50 MG PO TABS: 50.0000 mg | ORAL_TABLET | ORAL | 3 refills | Status: AC

## 2020-10-07 MED ORDER — IODIXANOL 320 MG/ML IV SOLN
INTRAVENOUS | Status: DC | PRN
  Administered 2020-10-07: 25 mL

## 2020-10-07 MED ORDER — ONDANSETRON HCL 4 MG/2ML IJ SOLN
INTRAMUSCULAR | Status: DC | PRN
  Administered 2020-10-07: 4 mg via INTRAMUSCULAR

## 2020-10-07 MED ORDER — HEPARIN (PORCINE) IN NACL 1000-0.9 UT/500ML-% IV SOLN
INTRAVENOUS | Status: DC | PRN
  Administered 2020-10-07 (×2): 500 mL

## 2020-10-07 SURGICAL SUPPLY — 14 items
CATH ANGIO 5F PIGTAIL 65CM (CATHETERS) ×3 IMPLANT
CLOSURE MYNX CONTROL 6F/7F (Vascular Products) ×3 IMPLANT
GUIDE CATH VISTA JR4 6F (CATHETERS) ×3 IMPLANT
GUIDEWIRE PRESSURE X 175 (WIRE) ×3 IMPLANT
KIT ESSENTIALS PG (KITS) ×3 IMPLANT
KIT MICROPUNCTURE NIT STIFF (SHEATH) ×3 IMPLANT
KIT PV (KITS) ×3 IMPLANT
SHEATH PINNACLE 6F 10CM (SHEATH) ×3 IMPLANT
SHEATH PROBE COVER 6X72 (BAG) ×3 IMPLANT
STOPCOCK MORSE 400PSI 3WAY (MISCELLANEOUS) ×3 IMPLANT
TRANSDUCER W/STOPCOCK (MISCELLANEOUS) ×3 IMPLANT
TRAY PV CATH (CUSTOM PROCEDURE TRAY) ×3 IMPLANT
TUBING CIL FLEX 10 FLL-RA (TUBING) ×3 IMPLANT
WIRE HITORQ VERSACORE ST 145CM (WIRE) ×3 IMPLANT

## 2020-10-07 NOTE — Interval H&P Note (Signed)
History and Physical Interval Note:  10/07/2020 7:53 AM  Angie Farrell  has presented today for surgery, with the diagnosis of renal artery stenosis.  The various methods of treatment have been discussed with the patient and family. After consideration of risks, benefits and other options for treatment, the patient has consented to  Procedure(s): RENAL ANGIOGRAPHY (N/A) and renal angioplasty as a surgical intervention.  The patient's history has been reviewed, patient examined, no change in status, stable for surgery.  I have reviewed the patient's chart and labs.  Questions were answered to the patient's satisfaction.     Adrian Prows

## 2020-10-07 NOTE — H&P (View-Only) (Signed)
REM  Primary Physician/Referring:  Mancel Bale, PA-C  Patient ID: Angie Farrell, female    DOB: 1970/06/13, 50 y.o.   MRN: [id number]  CC: Resistant hypertension  HPI:    Angie Farrell  is a 50 y.o. Caucasian female with history of hypertension, hyperlipidemia, family history of premature CAD, as well as anxiety. She was a non-smoker and nondiabetic and is late 51s and early 69s years of age.  She was referred to our office by PCP for further cardiovascular risk stratification and management of resistant hypertension.    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 She also underwent CT of the head and neck for evaluation of FMD ordered by Dr. Candiss Norse, nephrology.   Patient is very sensitive to chlorthalidone causing hypotension and also carvedilol causing hypotension as well in the past. Otherwise no specific complaints today, continues to have elevated blood pressure.     She now presents for planned procedure, renal angiography and possible angioplasty.  No new symptomatology.  Past Medical History:  Diagnosis Date   Allergy    Ectopic cardiac rhythm    Essential hypertension 06/14/2017   Family history of premature CAD 06/14/2017   H/O diverticulitis of colon    History of panic attacks 06/14/2017   Kidney stones    X 8   Perennial allergic rhinitis 06/14/2017   Past Surgical History:  Procedure Laterality Date   ABDOMINAL AORTOGRAM N/A 08/19/2020   Procedure: ABDOMINAL AORTOGRAM;  Surgeon: Adrian Prows, MD;  Location: [facility];  Service: Cardiovascular;  Laterality: N/A;   IR RADIOLOGIST EVAL & MGMT  10/06/2020   LITHOTRIPSY     X 3   RENAL ANGIOGRAPHY N/A 08/19/2020   Procedure: RENAL ANGIOGRAPHY;  Surgeon: Adrian Prows, MD;  Location: [facility];  Service: Cardiovascular;  Laterality: N/A;   TONSILLECTOMY  1981   Family History  Problem  Relation Age of Onset   Hypertension Mother    Heart disease Father 51   Hypertension Father    Tuberculosis Father    Heart attack Father    Hypertension Brother    Healthy Daughter    Drug abuse Son        heroine   Lung cancer Maternal Grandmother    Heart disease Maternal Grandmother 52   Heart attack Maternal Grandmother    Hearing loss Sister    Hyperlipidemia Sister    Hypertension Sister     Social History   Tobacco Use   Smoking status: Never   Smokeless tobacco: Never  Substance Use Topics   Alcohol use: Yes    Comment: social   Marital Status: Married   ROS  Review of Systems  Cardiovascular:  Negative for chest pain, dyspnea on exertion and leg swelling.  Gastrointestinal:  Negative for melena.   Objective  Blood pressure (!) 186/96, pulse 60, temperature 98.6 F (37 C), temperature source Oral, resp. rate 16, height 5' 3"  (1.6 m), weight 59 kg, SpO2 100 %.  Vitals with BMI 10/07/2020 09/01/2020 08/19/2020  Height 5' 3"  5' 4"  -  Weight 130 lbs 131 lbs 13 oz -  BMI 31.59 45.85 -  Systolic 929 244 628  Diastolic 96 638 85  Pulse 60 76 57      Physical Exam Constitutional:      Appearance: She is normal weight.  Neck:  Vascular: No carotid bruit or JVD.  Cardiovascular:     Rate and Rhythm: Normal rate and regular rhythm.     Pulses: Intact distal pulses.     Heart sounds: Normal heart sounds. No murmur heard.   No gallop.  Pulmonary:     Effort: Pulmonary effort is normal.     Breath sounds: Normal breath sounds.  Abdominal:     General: Bowel sounds are normal.     Palpations: Abdomen is soft.  Musculoskeletal:        General: No swelling.   Laboratory examination:   Lipid Panel Recent Labs    10/02/20 0822  CHOL 234*  TRIG 128  LDLCALC 175*  HDL 36*    HEMOGLOBIN A1C No results found for: HGBA1C, MPG  External labs:   04/08/2020: Total cholesterol 224, triglycerides 136, HDL 35, LDL 164  TSH 1.37, free T4 0.96  Glucose  87, BUN 12, creatinine 0.85, GFR 83, sodium 139, potassium 4.2, alk phos 100, AST 14, ALT 11  Hemoglobin 13.9, hematocrit 42.1, MCV 94, platelet 273  Urine analysis with reflex normal without proteinuria.  10/08/2016: A1c 5.5%   Medications and allergies  No Known Allergies   (Not in an outpatient encounter)  Medication as of today:  Current Meds  Medication Sig   acetaminophen (TYLENOL) 325 MG tablet Take 650 mg by mouth every 6 (six) hours as needed (for pain.).   aspirin EC 81 MG tablet Take 81 mg by mouth in the morning. Swallow whole.   cloNIDine (CATAPRES) 0.1 MG tablet Take 0.1 mg by mouth daily as needed (SBP > 190 mm Hg).   labetalol (NORMODYNE) 100 MG tablet Take 100 mg by mouth 2 (two) times daily.   rosuvastatin (CRESTOR) 20 MG tablet Take 1 tablet (20 mg total) by mouth daily. (Patient taking differently: Take 20 mg by mouth at bedtime.)   spironolactone (ALDACTONE) 50 MG tablet Take 1 tablet (50 mg total) by mouth daily.     Radiology:   CT angiogram of the head and neck 08/28/2020: 1. 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 A focal fenestration is a less likely differential consideration. 2. Severe right vertebral artery origin stenosis. 3. Small left carotid web. This finding has been reported to increase the risk of ischemic stroke. 4. No evidence of acute intracranial abnormality.  Cardiac Studies:   Bilateral renal artery duplex 05/02/2020: 1. Elevated right renal artery peak systolic velocity with renal artery to aortic ratio measuring 2.7. Findings are suggestive of renal artery stenosis/narrowing less than 60%. 2. No evidence of any left renal artery stenosis.  3. 2.8 cm gallstone   Echocardiogram 07/09/2020: Normal LV systolic function with visual EF 60-65%. Left ventricle cavity is normal in  size. Mild left ventricular hypertrophy. Normal global wall motion. Normal diastolic filling pattern, normal LAP. Mild tricuspid regurgitation. No evidence of pulmonary hypertension. No prior study for comparison.  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 This includes what  appears to be a 50% narrowing at the right renal artery origin  without significant atherosclerotic change.   Mild aortic atherosclerosis.  Aortic Atherosclerosis (ICD10-I70.0).   Coronary calcium score  07/10/2020: Coronary calcium score of 0, ascending and descending aortic measurements arenormal, Noncardiac findings include right  middle lobe 2 to 3 mm nodule most likely asubpleural lymph nodes.  If high risk consider noncontrast CT in 12 months.  Renal arteriogram 08/19/2020: Right renal artery ostium has a 30% stenosis with a 10 mmHg pressure gradient.  There is appearance of beaded FMD in the right distal renal artery.  There is accessory inferior pole renal artery which is widely patent. Left renal artery has very mild beaded appearance again in the distal segment.  Left inferior pole accessory renal arteries widely patent.  Recommendation: Patient's FMD appears to be much more distal especially on the right is more prominent than the left, in view of distal renal artery involvement, potential for dissection and renal loss is high.  Potential could be to use a scoring balloon or a short chocolate balloon, not available in the hospital presently.  I will change her metoprolol to labetalol and see her back in the office and discuss options.  EKG:   EKG 06/11/2020: Sinus rhythm with short PR interval, PR interval 110 ms.  Normal axis, no evidence of ischemia, otherwise normal EKG.     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 Recommendations:   Angie Farrell is a 50 y.o.  Caucasian female with history of hypertension, hyperlipidemia, family history of premature CAD, as well as anxiety. She was a non-smoker and nondiabetic and is late 30s and early 69s years of age.  S   I reviewed the results of the renal arteriogram and extension of the FMD all the way very close to the pelvis of the renal artery and the risks of dissection and loss of the knee discussed with the patient.  She understands the risks associated with balloon angioplasty.   She is willing to proceed.   Adrian Prows, MD, [facility] 10/07/2020, 7:50 AM Office: [phone] Fax: [phone] Pager: [phone]

## 2020-10-07 NOTE — Progress Notes (Addendum)
REM  Primary Physician/Referring:  Mancel Bale, PA-C  Patient ID: Angie Farrell, female    DOB: 1970/08/23, 50 y.o.   MRN: [id number]  CC: Resistant hypertension  HPI:    Angie Farrell  is a 50 y.o. Caucasian female with history of hypertension, hyperlipidemia, family history of premature CAD, as well as anxiety. Angie Farrell was a non-smoker and nondiabetic and is late 51s and early 12s years of age.  Angie Farrell was referred to our office by PCP for further cardiovascular risk stratification and management of resistant hypertension.    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 Angie Farrell also underwent CT of the head and neck for evaluation of FMD ordered by Dr. Candiss Norse, nephrology.   Patient is very sensitive to chlorthalidone causing hypotension and also carvedilol causing hypotension as well in the past. Otherwise no specific complaints today, continues to have elevated blood pressure.     Angie Farrell now presents for planned procedure, renal angiography and possible angioplasty.  No new symptomatology.  Past Medical History:  Diagnosis Date   Allergy    Ectopic cardiac rhythm    Essential hypertension 06/14/2017   Family history of premature CAD 06/14/2017   H/O diverticulitis of colon    History of panic attacks 06/14/2017   Kidney stones    X 8   Perennial allergic rhinitis 06/14/2017   Past Surgical History:  Procedure Laterality Date   ABDOMINAL AORTOGRAM N/A 08/19/2020   Procedure: ABDOMINAL AORTOGRAM;  Surgeon: Adrian Prows, MD;  Location: [facility];  Service: Cardiovascular;  Laterality: N/A;   IR RADIOLOGIST EVAL & MGMT  10/06/2020   LITHOTRIPSY     X 3   RENAL ANGIOGRAPHY N/A 08/19/2020   Procedure: RENAL ANGIOGRAPHY;  Surgeon: Adrian Prows, MD;  Location: [facility];  Service: Cardiovascular;  Laterality: N/A;   TONSILLECTOMY  1981   Family History  Problem  Relation Age of Onset   Hypertension Mother    Heart disease Father 68   Hypertension Father    Tuberculosis Father    Heart attack Father    Hypertension Brother    Healthy Daughter    Drug abuse Son        heroine   Lung cancer Maternal Grandmother    Heart disease Maternal Grandmother 69   Heart attack Maternal Grandmother    Hearing loss Sister    Hyperlipidemia Sister    Hypertension Sister     Social History   Tobacco Use   Smoking status: Never   Smokeless tobacco: Never  Substance Use Topics   Alcohol use: Yes    Comment: social   Marital Status: Married   ROS  Review of Systems  Cardiovascular:  Negative for chest pain, dyspnea on exertion and leg swelling.  Gastrointestinal:  Negative for melena.   Objective  Blood pressure (!) 186/96, pulse 60, temperature 98.6 F (37 C), temperature source Oral, resp. rate 16, height 5' 3"  (1.6 m), weight 59 kg, SpO2 100 %.  Vitals with BMI 10/07/2020 09/01/2020 08/19/2020  Height 5' 3"  5' 4"  -  Weight 130 lbs 131 lbs 13 oz -  BMI 00.34 91.79 -  Systolic 150 569 794  Diastolic 96 801 85  Pulse 60 76 57      Physical Exam Constitutional:      Appearance: Angie Farrell is normal weight.  Neck:  Vascular: No carotid bruit or JVD.  Cardiovascular:     Rate and Rhythm: Normal rate and regular rhythm.     Pulses: Intact distal pulses.     Heart sounds: Normal heart sounds. No murmur heard.   No gallop.  Pulmonary:     Effort: Pulmonary effort is normal.     Breath sounds: Normal breath sounds.  Abdominal:     General: Bowel sounds are normal.     Palpations: Abdomen is soft.  Musculoskeletal:        General: No swelling.   Laboratory examination:   Lipid Panel Recent Labs    10/02/20 0822  CHOL 234*  TRIG 128  LDLCALC 175*  HDL 36*    HEMOGLOBIN A1C No results found for: HGBA1C, MPG  External labs:   04/08/2020: Total cholesterol 224, triglycerides 136, HDL 35, LDL 164  TSH 1.37, free T4 0.96  Glucose  87, BUN 12, creatinine 0.85, GFR 83, sodium 139, potassium 4.2, alk phos 100, AST 14, ALT 11  Hemoglobin 13.9, hematocrit 42.1, MCV 94, platelet 273  Urine analysis with reflex normal without proteinuria.  10/08/2016: A1c 5.5%   Medications and allergies  No Known Allergies   (Not in an outpatient encounter)  Medication as of today:  Current Meds  Medication Sig   acetaminophen (TYLENOL) 325 MG tablet Take 650 mg by mouth every 6 (six) hours as needed (for pain.).   aspirin EC 81 MG tablet Take 81 mg by mouth in the morning. Swallow whole.   cloNIDine (CATAPRES) 0.1 MG tablet Take 0.1 mg by mouth daily as needed (SBP > 190 mm Hg).   labetalol (NORMODYNE) 100 MG tablet Take 100 mg by mouth 2 (two) times daily.   rosuvastatin (CRESTOR) 20 MG tablet Take 1 tablet (20 mg total) by mouth daily. (Patient taking differently: Take 20 mg by mouth at bedtime.)   spironolactone (ALDACTONE) 50 MG tablet Take 1 tablet (50 mg total) by mouth daily.     Radiology:   CT angiogram of the head and neck 08/28/2020: 1. 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 A focal fenestration is a less likely differential consideration. 2. Severe right vertebral artery origin stenosis. 3. Small left carotid web. This finding has been reported to increase the risk of ischemic stroke. 4. No evidence of acute intracranial abnormality.  Cardiac Studies:   Bilateral renal artery duplex 05/02/2020: 1. Elevated right renal artery peak systolic velocity with renal artery to aortic ratio measuring 2.7. Findings are suggestive of renal artery stenosis/narrowing less than 60%. 2. No evidence of any left renal artery stenosis.  3. 2.8 cm gallstone   Echocardiogram 07/09/2020: Normal LV systolic function with visual EF 60-65%. Left ventricle cavity is normal in  size. Mild left ventricular hypertrophy. Normal global wall motion. Normal diastolic filling pattern, normal LAP. Mild tricuspid regurgitation. No evidence of pulmonary hypertension. No prior study for comparison.  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 This includes what  appears to be a 50% narrowing at the right renal artery origin  without significant atherosclerotic change.   Mild aortic atherosclerosis.  Aortic Atherosclerosis (ICD10-I70.0).   Coronary calcium score  07/10/2020: Coronary calcium score of 0, ascending and descending aortic measurements arenormal, Noncardiac findings include right  middle lobe 2 to 3 mm nodule most likely asubpleural lymph nodes.  If high risk consider noncontrast CT in 12 months.  Renal arteriogram 08/19/2020: Right renal artery ostium has a 30% stenosis with a 10 mmHg pressure gradient.  There is appearance of beaded FMD in the right distal renal artery.  There is accessory inferior pole renal artery which is widely patent. Left renal artery has very mild beaded appearance again in the distal segment.  Left inferior pole accessory renal arteries widely patent.  Recommendation: Patient's FMD appears to be much more distal especially on the right is more prominent than the left, in view of distal renal artery involvement, potential for dissection and renal loss is high.  Potential could be to use a scoring balloon or a short chocolate balloon, not available in the hospital presently.  I will change her metoprolol to labetalol and see her back in the office and discuss options.  EKG:   EKG 06/11/2020: Sinus rhythm with short PR interval, PR interval 110 ms.  Normal axis, no evidence of ischemia, otherwise normal EKG.     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 Recommendations:   Angie Farrell is a 50 y.o.  Caucasian female with history of hypertension, hyperlipidemia, family history of premature CAD, as well as anxiety. Angie Farrell was a non-smoker and nondiabetic and is late 45s and early 56s years of age.  S   I reviewed the results of the renal arteriogram and extension of the FMD all the way very close to the pelvis of the renal artery and the risks of dissection and loss of the knee discussed with the patient.  Angie Farrell understands the risks associated with balloon angioplasty.   Angie Farrell is willing to proceed.   Adrian Prows, MD, [facility] 10/07/2020, 7:50 AM Office: [phone] Fax: [phone] Pager: [phone]

## 2020-10-09 ENCOUNTER — Telehealth: Payer: Self-pay | Admitting: Pharmacist

## 2020-10-14 ENCOUNTER — Encounter (HOSPITAL_COMMUNITY): Payer: Self-pay | Admitting: Cardiology

## 2020-10-16 ENCOUNTER — Other Ambulatory Visit: Payer: Self-pay | Admitting: Student

## 2020-10-16 NOTE — H&P (Signed)
Chief Complaint: Patient was seen in consultation today for diagnostic cerebral angiogram  Referring Physician(s): Deveshwar,Sanjeev  Supervising Physician: Luanne Bras  Patient Status: [facility] - Out-pt  History of Present Illness: Angie Farrell is a 50 y.o. female with a medical history significant for HTN, kidney stones and fibromuscular dysplasia of the renal arteries. She met with Dr. 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 She complains of parietoccipital headaches that can be severe at times. Dr. Estanislado Pandy recommended a diagnostic cerebral angiogram for further work up and after a discussion of the risk and benefits, the patient was in agreement to proceed.  Past Medical History:  Diagnosis Date   Allergy    Ectopic cardiac rhythm    Essential hypertension 06/14/2017   Family history of premature CAD 06/14/2017   H/O diverticulitis of colon    History of panic attacks 06/14/2017   Kidney stones    X 8   Perennial allergic rhinitis 06/14/2017    Past Surgical History:  Procedure Laterality Date   ABDOMINAL AORTOGRAM N/A 08/19/2020   Procedure: ABDOMINAL AORTOGRAM;  Surgeon: Adrian Prows, MD;  Location: [place];  Service: Cardiovascular;  Laterality: N/A;   INTRAVASCULAR PRESSURE WIRE/FFR STUDY Bilateral 10/07/2020   Procedure: INTRAVASCULAR PRESSURE WIRE/FFR STUDY;  Surgeon: Adrian Prows, MD;  Location: [facility];  Service: Cardiovascular;  Laterality: Bilateral;  RENAL ARTERIES   IR RADIOLOGIST EVAL & MGMT  10/06/2020   LITHOTRIPSY     X 3   RENAL ANGIOGRAPHY N/A 08/19/2020   Procedure: RENAL ANGIOGRAPHY;  Surgeon: Adrian Prows, MD;  Location: [place] [facility];  Service: Cardiovascular;  Laterality: N/A;   RENAL ANGIOGRAPHY N/A 10/07/2020   Procedure: RENAL ANGIOGRAPHY;  Surgeon: Adrian Prows, MD;  Location: [place] [facility];  Service: Cardiovascular;   Laterality: N/A;   TONSILLECTOMY  1981    Allergies: Patient has no known allergies.  Medications: Prior to Admission medications   Medication Sig Start Date End Date Taking? Authorizing Provider  acetaminophen (TYLENOL) 325 MG tablet Take 650 mg by mouth every 6 (six) hours as needed (for pain.).    [provider]  ALPRAZolam Duanne Moron) 0.5 MG tablet Take 0.5 mg by mouth daily as needed for anxiety.    [provider]  aspirin EC 81 MG tablet Take 81 mg by mouth in the morning. Swallow whole.    [provider]  cloNIDine (CATAPRES) 0.1 MG tablet Take 0.1 mg by mouth daily as needed (SBP > 190 mm Hg). Patient not taking: Reported on 10/09/2020    [provider]  hydrALAZINE (APRESOLINE) 50 MG tablet Take 1 tablet (50 mg total) by mouth 4 (four) times daily as needed (BP >160/100). Patient not taking: Reported on 10/09/2020 09/01/20 11/30/20  Adrian Prows, MD  labetalol (NORMODYNE) 100 MG tablet Take 100 mg by mouth 2 (two) times daily.    [provider]  rosuvastatin (CRESTOR) 20 MG tablet Take 1 tablet (20 mg total) by mouth daily. Patient taking differently: Take 20 mg by mouth at bedtime. 07/23/20 10/21/20  Adrian Prows, MD  spironolactone (ALDACTONE) 50 MG tablet Take 1 tablet (50 mg total) by mouth every morning. 10/07/20   Adrian Prows, MD     Family History  Problem Relation Age of Onset   Hypertension Mother    Heart disease Father 63   Hypertension Father    Tuberculosis  Father    Heart attack Father    Hypertension Brother    Healthy Daughter    Drug abuse Son        heroine   Lung cancer Maternal Grandmother    Heart disease Maternal Grandmother 21   Heart attack Maternal Grandmother    Hearing loss Sister    Hyperlipidemia Sister    Hypertension Sister     Social History   Socioeconomic History   Marital status: Married    Spouse name: Not on file   Number of children: 2   Years of education: Not on file   Highest education  level: Not on file  Occupational History   Occupation: Electronics engineer, 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    Husband works for [employer]   2 grown children; live in [place] Strain: Not on [employer] Insecurity: Not on file  Transportation Needs: Not on file  Physical Activity: Not on file  Stress: Not on file  Social Connections: Not on file    Review of Systems: A 12 point ROS discussed and pertinent positives are indicated in the HPI above.  All other systems are negative.  Review of Systems  Constitutional:  Negative for appetite change and fatigue.  Respiratory:  Negative for cough and shortness of breath.   Gastrointestinal:  Negative for abdominal pain, diarrhea, nausea and vomiting.  Neurological:  Negative for dizziness and headaches.   Vital Signs: BP (!) 178/92   Pulse 64   Temp 98.4 F (36.9 C) (Oral)   Ht _0  (1.626 m)   Wt 130 lb (59 kg)   SpO2 99%   BMI 22.31 kg/m   Physical Exam Constitutional:      General: She is not in acute distress.    Appearance: She is not ill-appearing.  HENT:     Mouth/Throat:     Mouth: Mucous membranes are moist.     Pharynx: Oropharynx is clear.  Cardiovascular:     Pulses: Normal pulses.     Heart sounds: Normal heart sounds.     Comments: Right groin vascular site from recent renal angiogram approximately two weeks ago. Per patient report this site is bruised and tender.  Pulmonary:     Effort: Pulmonary effort is normal.     Breath  sounds: Normal breath sounds.  Abdominal:     General: Bowel sounds are normal.     Palpations: Abdomen is soft.     Tenderness: There is no abdominal tenderness.  Skin:    General: Skin is warm and dry.  Neurological:     Mental Status: She is alert and oriented to person, place, and time.    Imaging: CARDIAC CATHETERIZATION  Result Date: 10/07/2020 Selective bilateral renal arteriogram 10/07/2020: Right renal artery shows atherosclerotic 20 to 30% stenosis at the ostium.  There is mild beaded appearance of the distal radial artery suggestive of FMD.  RFR was not hemodynamically significant, no pressure gradient at all across the FMD. Left renal artery shows smooth appearance, there is mild haziness in the mid segment of the proximal left renal artery.  RFR did not reveal any hemodynamically significant pressure gradient. Recommendation: Patient also has FMD, there is no pressure gradient and there is no hemodynamic compromise.  Suspect patient has resistant hypertension/primary hypertension.  Patient will be discharged home today with outpatient follow-up.   PERIPHERAL VASCULAR CATHETERIZATION  Result Date: 10/07/2020 Selective bilateral renal arteriogram 10/07/2020: Right renal artery shows atherosclerotic 20 to 30% stenosis at the ostium.  There is mild beaded appearance of the distal radial artery suggestive of FMD.  RFR was not hemodynamically significant, no pressure gradient at all across the FMD. Left renal artery shows smooth appearance, there is mild haziness in the mid segment of the proximal left renal artery.  RFR did not reveal any hemodynamically significant pressure gradient. Recommendation: Patient also has FMD, there is no pressure gradient and there is no hemodynamic compromise.  Suspect patient has resistant hypertension/primary hypertension.  Patient will be discharged home today with outpatient follow-up.   IR Radiologist Eval & Mgmt  Result Date: 10/06/2020 EXAM: NEW  PATIENT OFFICE VISIT CHIEF COMPLAINT: Fibro muscular dysplasia of the renal arteries. Abnormal recent CT angiogram of the head and neck. Current Pain Level: 1-10 HISTORY OF PRESENT ILLNESS: Patient is a 50 year old right handed lady with history of hypertension, hyperlipidemia, family history of premature coronary artery disease and anxiety referred for evaluation and management of recently discovered abnormality on CT angiogram of the head and neck. Patient has a documented history of renal fibromuscular dysplasia as per recent studies performed by her cardiologist, Dr. Einar Gip. This revealed bilateral renal artery stenosis with beading, right greater than left. The patient is apparently scheduled to undergo endovascular treatment of right renal artery with angioplasty sometime this week. On close questioning, the patient reports episodes of the right parietooccipital headaches with modest temporary debilitation. The most recent of this was approximately 6-8 months ago. She describes the headache to be severe, without associated neurological symptoms of nausea, vomiting, photophobia, dizziness, vertigo, gait instability, visual aberrations, or speech or motor difficulties. Patient reports a prolonged history of difficult to control systemic blood pressure despite multiple medical regimens over the years. During workup for poorly controlled hypertension despite aggressive medical regimen, patient underwent renal ultrasound and a CTA of the abdomen which subsequently revealed the presence of bilateral renal artery stenosis with angiographic findings suggestive of fibromuscular dysplasia. 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 No evidence of pseudoaneurysm of an intraluminal filling defect identified extra cranially or intracranially. Constitutionally the patient denies any recent chills, fever or rigors or symptoms of loss of awareness or of seizure-like activity. She denies specifically diplopia, amaurosis fugax, vertigo, pulsatile tinnitus, or of episodes of loss of awareness, or of seizure-like  activity. Patient is fairly active. She denies smoking cigarettes or using illicit chemicals. Diagnosis * : Date . * : Allergy * : . * : Ectopic cardiac rhythm * : . * : Essential hypertension * : 06/14/2017 . * : Family history of premature CAD * : 06/14/2017 . * : H/O diverticulitis of colon * : . * : History of panic attacks * : 06/14/2017 . * : Kidney stones * : * : X 8 . * : Perennial allergic rhinitis * : 06/14/2017 Past Surgical History: Procedure * : Laterality * : Date . * : ABDOMINAL AORTOGRAM * : N/A * : 08/19/2020 * : Procedure: ABDOMINAL AORTOGRAM; Surgeon: Adrian Prows, MD; Location: [place] [facility]; Service: Cardiovascular; Laterality: N/A; . * : LITHOTRIPSY * : * : * : X 3 . * : RENAL ANGIOGRAPHY * : N/A * : 08/19/2020 * : Procedure: RENAL ANGIOGRAPHY; Surgeon: Adrian Prows, MD; Location: [facility]; Service: Cardiovascular; Laterality: N/A; . * : TONSILLECTOMY * : * : 1981 Family History Problem * : Relation * : Age of Onset . * : Hypertension * : Mother * : . * : Heart disease * : Father * : 34 . * : Hypertension * : Father * : . * : Tuberculosis * : Father * : . * : Heart attack * : Father * : . * : Hypertension * : Brother * : . * : Healthy * : Daughter * : . * :  Drug abuse * : Son * : * :     heroine . * : Lung cancer * : Maternal Grandmother * : . * : Heart disease * : Maternal Grandmother * : 15 . * : Heart attack * : Maternal Grandmother * : . * : Hearing loss * : Sister * : . * : Hyperlipidemia * : Sister * : . * : Hypertension * : Sister * : Tobacco Use . * : Smoking status: * : Never . * : Smokeless tobacco: * : Never Substance Use Topics . * : Alcohol use: * : Yes * : * : Comment: social Marital Status: Married Allergies:  No known allergies. REVIEW OF SYSTEMS: Negative unless as mentioned above. PHYSICAL EXAMINATION: Grossly awake, alert, oriented to time place space. In no acute distress. Affect appropriate. Normal eye contact. Grossly no lateralizing neurological abnormalities identified. ASSESSMENT AND PLAN: Above findings of the CT angiogram of the head and neck were reviewed, in addition to reviewing the other imaging findings on the renal arteriograms, and CT angiogram of the abdomen. Right vertebral artery findings of severe proximal stenosis, and the abnormalities noted at C5 could be related to underlying fibromuscular dysplasia, with sequelae there of. The fusiform dilatation in the cavernous segment of the right internal carotid artery, and of the arteries at the distal cervical petrous junction could be result of chronic difficulty controlled hypertension versus possible concomitant underlying fibromuscular dysplasia. In order to further more accurately identify angio architecture of these abnormalities, especially the high-grade stenosis suspected of the right vertebral artery and suspected dissection, diagnostic catheter arteriogram would be appropriate. Depending on the findings of the arteriogram, future management considerations could be planned. The diagnostic catheter arteriogram would be either via the radial artery route, or the common femoral artery route. The patient is agreeable to proceed with a diagnostic catheter arteriogram which will be  scheduled as soon as possible right after her renal arteriogram planned for next week. Patient expressed  understanding and agreement with the above management plan. Electronically Signed   By: Luanne Bras M.D.   On: 10/03/2020 15:01    Labs:  CBC: Recent Labs    08/13/20 0939 10/07/20 0541  WBC 7.8 9.0  HGB 13.5 12.8  HCT 41.0 38.3  PLT 247 252    COAGS: No results for input(s): INR, APTT in the last 8760 hours.  BMP: Recent Labs    08/13/20 0939 10/02/20 0822  NA 141 140  K 4.2 4.6  CL 103 103  CO2 22 22  GLUCOSE 83 91  BUN 12 10  CALCIUM 9.4 9.1  CREATININE 0.95 0.88    LIVER FUNCTION TESTS: No results for input(s): BILITOT, AST, ALT, ALKPHOS, PROT, ALBUMIN in the last 8760 hours.  TUMOR MARKERS: No results for input(s): AFPTM, CEA, CA199, CHROMGRNA in the last 8760 hours.  Assessment and Plan:  Right vertebral artery stenosis: Angie Farrell, 50 year old female, presents today to the [facility] department for an image-guided diagnostic cerebral angiogram.  Risks and benefits of this procedure were discussed with the patient including, but not limited to bleeding, infection, vascular injury or contrast induced renal failure.  This interventional procedure involves the use of X-rays and because of the nature of the planned procedure, it is possible that we will have prolonged use of X-ray fluoroscopy.  Potential radiation risks to you include (but are not limited to) the following: - A slightly elevated risk for cancer  several years later in life. This risk is typically less than 0.5% percent. This risk is low in comparison to the normal incidence of human cancer, which is 33% for women and 50% for men according to the [place]. - Radiation induced injury can include skin redness, resembling a rash, tissue breakdown / ulcers and hair loss (which can be temporary or permanent).   The likelihood of either of  these occurring depends on the difficulty of the procedure and whether you are sensitive to radiation due to previous procedures, disease, or genetic conditions.   IF your procedure requires a prolonged use of radiation, you will be notified and given written instructions for further action.  It is your responsibility to monitor the irradiated area for the 2 weeks following the procedure and to notify your physician if you are concerned that you have suffered a radiation induced injury.    All of the patient's questions were answered, patient is agreeable to proceed. She has been NPO. Labs are pending but will be reviewed prior to the start of the procedure.   Consent signed and in chart.  Thank you for this interesting consult.  I greatly enjoyed meeting Angie Farrell and look forward to participating in their care.  A copy of this report was sent to the requesting provider on this date.  Electronically Signed: Soyla Dryer, AGACNP-BC [phone] 10/17/2020, 8:03 AM   I spent a total of  30 Minutes   in face to face in clinical consultation, greater than 50% of which was counseling/coordinating care for diagnostic cerebral angiogram.

## 2020-10-17 ENCOUNTER — Other Ambulatory Visit (HOSPITAL_COMMUNITY): Payer: Self-pay | Admitting: Interventional Radiology

## 2020-10-17 ENCOUNTER — Ambulatory Visit (HOSPITAL_COMMUNITY)
Admission: RE | Admit: 2020-10-17 | Discharge: 2020-10-17 | Disposition: A | Discharge status: Home / Self Care | Payer: Managed Care, Other (non HMO) | Source: Ambulatory Visit | Attending: Interventional Radiology | Admitting: Interventional Radiology

## 2020-10-17 ENCOUNTER — Other Ambulatory Visit: Payer: Self-pay

## 2020-10-17 DIAGNOSIS — Z79899 Other long term (current) drug therapy: Secondary | ICD-10-CM | POA: Insufficient documentation

## 2020-10-17 DIAGNOSIS — Z8249 Family history of ischemic heart disease and other diseases of the circulatory system: Secondary | ICD-10-CM | POA: Insufficient documentation

## 2020-10-17 DIAGNOSIS — I6509 Occlusion and stenosis of unspecified vertebral artery: Secondary | ICD-10-CM

## 2020-10-17 DIAGNOSIS — Z7982 Long term (current) use of aspirin: Secondary | ICD-10-CM | POA: Diagnosis not present

## 2020-10-17 DIAGNOSIS — I10 Essential (primary) hypertension: Secondary | ICD-10-CM | POA: Insufficient documentation

## 2020-10-17 HISTORY — PX: IR US GUIDE VASC ACCESS RIGHT: IMG2390

## 2020-10-17 HISTORY — PX: IR ANGIO VERTEBRAL SEL VERTEBRAL BILAT MOD SED: IMG5369

## 2020-10-17 HISTORY — PX: IR ANGIO INTRA EXTRACRAN SEL COM CAROTID INNOMINATE BILAT MOD SED: IMG5360

## 2020-10-17 LAB — BASIC METABOLIC PANEL
Anion gap: 6 (ref 5–15)
BUN: 11 mg/dL (ref 6–20)
CO2: 26 mmol/L (ref 22–32)
Calcium: 9.3 mg/dL (ref 8.9–10.3)
Chloride: 106 mmol/L (ref 98–111)
Creatinine, Ser: 0.92 mg/dL (ref 0.44–1.00)
GFR, Estimated: >60 mL/min (ref >60)
Glucose, Bld: 96 mg/dL (ref 70–99)
Potassium: 4 mmol/L (ref 3.5–5.1)
Sodium: 138 mmol/L (ref 135–145)

## 2020-10-17 LAB — CBC
HCT: 39 % (ref 36.0–46.0)
Hemoglobin: 13.1 g/dL (ref 12.0–15.0)
MCH: 33 pg (ref 26.0–34.0)
MCHC: 33.6 g/dL (ref 30.0–36.0)
MCV: 98.2 fL (ref 80.0–100.0)
Platelets: 290 10*3/uL (ref 150–400)
RBC: 3.97 MIL/uL (ref 3.87–5.11)
RDW: 11.9 % (ref 11.5–15.5)
WBC: 7.5 10*3/uL (ref 4.0–10.5)
nRBC: 0 % (ref 0.0–0.2)

## 2020-10-17 LAB — PROTIME-INR
INR: 1 (ref 0.8–1.2)
Prothrombin Time: 12.8 seconds (ref 11.4–15.2)

## 2020-10-17 LAB — APTT: aPTT: 28 seconds (ref 24–36)

## 2020-10-17 MED ORDER — FENTANYL CITRATE (PF) 100 MCG/2ML IJ SOLN
INTRAMUSCULAR | Status: DC | PRN
  Administered 2020-10-17: 25 ug via INTRAVENOUS

## 2020-10-17 MED ORDER — FENTANYL CITRATE (PF) 100 MCG/2ML IJ SOLN
INTRAMUSCULAR | Status: AC
  Filled 2020-10-17: qty 2

## 2020-10-17 MED ORDER — ACETAMINOPHEN 500 MG PO TABS
ORAL_TABLET | ORAL | Status: AC
  Administered 2020-10-17: 1000 mg
  Filled 2020-10-17: qty 2

## 2020-10-17 MED ORDER — HEPARIN SODIUM (PORCINE) 1000 UNIT/ML IJ SOLN
INTRAMUSCULAR | Status: AC
  Filled 2020-10-17: qty 1

## 2020-10-17 MED ORDER — MIDAZOLAM HCL 2 MG/2ML IJ SOLN
INTRAMUSCULAR | Status: AC
  Filled 2020-10-17: qty 2

## 2020-10-17 MED ORDER — NITROGLYCERIN 1 MG/10 ML FOR IR/CATH LAB
INTRA_ARTERIAL | Status: AC
  Filled 2020-10-17: qty 10

## 2020-10-17 MED ORDER — MIDAZOLAM HCL 2 MG/2ML IJ SOLN
INTRAMUSCULAR | Status: DC | PRN
  Administered 2020-10-17: 1 mg via INTRAVENOUS

## 2020-10-17 MED ORDER — ACETAMINOPHEN 500 MG PO TABS
1000.0000 mg | ORAL_TABLET | Freq: Once | ORAL | Status: DC
  Filled 2020-10-17: qty 2

## 2020-10-17 MED ORDER — FENTANYL CITRATE (PF) 100 MCG/2ML IJ SOLN
INTRAMUSCULAR | Status: AC
  Filled 2020-10-17: qty 4

## 2020-10-17 MED ORDER — LIDOCAINE HCL 1 % IJ SOLN
INTRAMUSCULAR | Status: AC
  Filled 2020-10-17: qty 20

## 2020-10-17 MED ORDER — IOHEXOL 350 MG/ML SOLN
100.0000 mL | Freq: Once | INTRAVENOUS | Status: AC | PRN
  Administered 2020-10-17: 73 mL via INTRA_ARTERIAL

## 2020-10-17 MED ORDER — VERAPAMIL HCL 2.5 MG/ML IV SOLN: INTRA_ARTERIAL | Status: DC | PRN

## 2020-10-17 MED ORDER — SODIUM CHLORIDE 0.9 % IV SOLN: INTRAVENOUS | Status: AC

## 2020-10-17 MED ORDER — LIDOCAINE HCL (PF) 1 % IJ SOLN
INTRAMUSCULAR | Status: DC | PRN
  Administered 2020-10-17: 5 mL

## 2020-10-17 MED ORDER — VERAPAMIL HCL 2.5 MG/ML IV SOLN
INTRAVENOUS | Status: AC
  Filled 2020-10-17: qty 2

## 2020-10-17 MED ORDER — SODIUM CHLORIDE 0.9 % IV SOLN: INTRAVENOUS | Status: DC

## 2020-10-17 MED ORDER — SODIUM CHLORIDE 0.9 % IV SOLN
INTRAVENOUS | Status: DC | PRN
  Administered 2020-10-17: 75 mL/h via INTRAVENOUS
  Administered 2020-10-17: 250 mL via INTRAVENOUS

## 2020-10-17 MED ORDER — MIDAZOLAM HCL 2 MG/2ML IJ SOLN
INTRAMUSCULAR | Status: AC
  Filled 2020-10-17: qty 4

## 2020-10-17 MED ORDER — HEPARIN SODIUM (PORCINE) 1000 UNIT/ML IJ SOLN
INTRAMUSCULAR | Status: DC | PRN
Start: 2020-10-17 — End: 2020-10-18
  Administered 2020-10-17: 2000 [IU] via INTRAVENOUS

## 2020-10-17 NOTE — Sedation Documentation (Signed)
Nurse transport patient to short stay room 16 assessed TRBand intact no bleeding or hematoma.

## 2020-10-17 NOTE — Sedation Documentation (Signed)
Doctor talking with patient.

## 2020-10-17 NOTE — Procedures (Signed)
S/P 4 vessel cerebral arteriogram RT rad approach. Findings. 1.Severe RT VA origin stenosis. 2. 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  S.Mc Bloodworth MD

## 2020-10-21 ENCOUNTER — Ambulatory Visit: Payer: Managed Care, Other (non HMO) | Admitting: Cardiology

## 2020-10-21 ENCOUNTER — Other Ambulatory Visit: Payer: Self-pay

## 2020-10-21 VITALS — BP 133/75 | HR 63 | Temp 97.8°F | Resp 17 | Ht 64.0 in | Wt 129.8 lb

## 2020-10-21 DIAGNOSIS — E7841 Elevated Lipoprotein(a): Secondary | ICD-10-CM

## 2020-10-21 DIAGNOSIS — I773 Arterial fibromuscular dysplasia: Secondary | ICD-10-CM

## 2020-10-21 DIAGNOSIS — I10 Essential (primary) hypertension: Secondary | ICD-10-CM

## 2020-10-21 DIAGNOSIS — I701 Atherosclerosis of renal artery: Secondary | ICD-10-CM

## 2020-10-21 DIAGNOSIS — E782 Mixed hyperlipidemia: Secondary | ICD-10-CM

## 2020-10-21 MED ORDER — HYDROCHLOROTHIAZIDE 12.5 MG PO TABS: 50.0000 mg | ORAL_TABLET | ORAL | 2 refills | Status: DC

## 2020-10-21 NOTE — Progress Notes (Signed)
Primary Physician/Referring:  Mancel Bale, PA-C  Patient ID: Angie Farrell, female    DOB: 1970/08/31, 50 y.o.   MRN: [id number]  No chief complaint on file.  HPI:    Angie Farrell  is a 50 y.o. Caucasian female with history of hypertension, hyperlipidemia, family history of premature CAD in her mother who was a non-smoker and nondiabetic and is late 47s and early 68s years of age.    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  Due to resistant hypertension, she underwent repeat renal arteriogram and FFR/RFR on 10/07/2020 and presents for follow-up.  She remains asymptomatic.  Since being on regular doses of spironolactone instead of as needed, also labetalol on a regular basis, blood pressure has improved significantly.  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  Presents for follow-up of hypertension and hyperlipidemia.   Patient is very sensitive to chlorthalidone causing hypotension and also carvedilol causing hypotension as well in the past. Otherwise no specific complaints today, continues to have elevated blood pressure.     Past Medical History:  Diagnosis Date   Allergy    Ectopic cardiac rhythm    Essential hypertension 06/14/2017   Family history of premature CAD 06/14/2017   H/O diverticulitis of colon    History of panic attacks 06/14/2017   Kidney stones    X 8   Perennial allergic rhinitis 06/14/2017   Past Surgical History:  Procedure Laterality Date   ABDOMINAL AORTOGRAM N/A 08/19/2020   Procedure: ABDOMINAL AORTOGRAM;  Surgeon: Adrian Prows, MD;  Location: [facility];  Service: Cardiovascular;  Laterality: N/A;   INTRAVASCULAR PRESSURE WIRE/FFR STUDY Bilateral 10/07/2020   Procedure: INTRAVASCULAR PRESSURE WIRE/FFR STUDY;  Surgeon: Adrian Prows, MD;  Location: [facility];  Service: Cardiovascular;  Laterality: Bilateral;  RENAL ARTERIES   IR RADIOLOGIST EVAL & MGMT  10/06/2020    LITHOTRIPSY     X 3   RENAL ANGIOGRAPHY N/A 08/19/2020   Procedure: RENAL ANGIOGRAPHY;  Surgeon: Adrian Prows, MD;  Location: [facility];  Service: Cardiovascular;  Laterality: N/A;   RENAL ANGIOGRAPHY N/A 10/07/2020   Procedure: RENAL ANGIOGRAPHY;  Surgeon: Adrian Prows, MD;  Location: [facility];  Service: Cardiovascular;  Laterality: N/A;   TONSILLECTOMY  1981   Family History  Problem Relation Age of Onset   Hypertension Mother    Heart disease Father 23   Hypertension Father    Tuberculosis Father    Heart attack Father    Hypertension Brother    Healthy Daughter    Drug abuse Son        heroine   Lung cancer Maternal Grandmother    Heart disease Maternal Grandmother 84   Heart attack Maternal Grandmother    Hearing loss Sister    Hyperlipidemia Sister    Hypertension Sister     Social History   Tobacco Use   Smoking status: Never   Smokeless tobacco: Never  Substance Use Topics   Alcohol use: Yes    Comment: social   Marital Status: Married   ROS  Review of Systems  Cardiovascular:  Negative for chest pain, dyspnea on exertion and leg swelling.  Gastrointestinal:  Negative for melena.   Objective  There were no vitals taken for this visit.  Vitals with BMI 10/17/2020 10/17/2020 10/17/2020  Height - - -  Weight - - -  BMI - - -  Systolic -  867 619  Diastolic - 80 82  Pulse 62 67 69      Physical Exam Constitutional:      Appearance: She is normal weight.  Neck:     Vascular: No carotid bruit or JVD.  Cardiovascular:     Rate and Rhythm: Normal rate and regular rhythm.     Pulses: Intact distal pulses.     Heart sounds: Normal heart sounds. No murmur heard.   No gallop.  Pulmonary:     Effort: Pulmonary effort is normal.     Breath sounds: Normal breath sounds.  Abdominal:     General: Bowel sounds are normal.     Palpations: Abdomen is soft.  Musculoskeletal:        General: No swelling.   Laboratory examination:   CMP Latest Ref  Rng & Units 10/17/2020 10/02/2020 08/13/2020  Glucose 70 - 99 mg/dL 96 91 83  BUN 6 - 20 mg/dL 11 10 12   Creatinine 0.44 - 1.00 mg/dL 0.92 0.88 0.95  Sodium 135 - 145 mmol/L 138 140 141  Potassium 3.5 - 5.1 mmol/L 4.0 4.6 4.2  Chloride 98 - 111 mmol/L 106 103 103  CO2 22 - 32 mmol/L 26 22 22   Calcium 8.9 - 10.3 mg/dL 9.3 9.1 9.4  Total Protein 6.0 - 8.3 g/dL - - -  Total Bilirubin 0.2 - 1.2 mg/dL - - -  Alkaline Phos 39 - 117 U/L - - -  AST 0 - 37 U/L - - -  ALT 0 - 35 U/L - - -   CBC Latest Ref Rng & Units 10/17/2020 10/07/2020 08/13/2020  WBC 4.0 - 10.5 K/uL 7.5 9.0 7.8  Hemoglobin 12.0 - 15.0 g/dL 13.1 12.8 13.5  Hematocrit 36.0 - 46.0 % 39.0 38.3 41.0  Platelets 150 - 400 K/uL 290 252 247   Lipid Panel     Component Value Date/Time   CHOL 234 (H) 10/02/2020 0822   TRIG 128 10/02/2020 0822   HDL 36 (L) 10/02/2020 0822   CHOLHDL 5 11/08/2017 1406   VLDL 30.0 11/08/2017 1406   LDLCALC 175 (H) 10/02/2020 0822   Component Ref Range & Units 10/02/2020   Lipoprotein (a) <75.0 nmol/L 94.4 High     HEMOGLOBIN A1C No results found for: HGBA1C, MPG  External labs:   04/08/2020: Total cholesterol 224, triglycerides 136, HDL 35, LDL 164  TSH 1.37, free T4 0.96  Glucose 87, BUN 12, creatinine 0.85, GFR 83, sodium 139, potassium 4.2, alk phos 100, AST 14, ALT 11  Hemoglobin 13.9, hematocrit 42.1, MCV 94, platelet 273  Urine analysis with reflex normal without proteinuria.  Medications and allergies  No Known Allergies   Outpatient Medications Prior to Visit  Medication Sig Dispense Refill   acetaminophen (TYLENOL) 325 MG tablet Take 650 mg by mouth every 6 (six) hours as needed (for pain.).     ALPRAZolam (XANAX) 0.5 MG tablet Take 0.5 mg by mouth daily as needed for anxiety.     aspirin EC 81 MG tablet Take 81 mg by mouth in the morning. Swallow whole.     cloNIDine (CATAPRES) 0.1 MG tablet Take 0.1 mg by mouth daily as needed (SBP > 190 mm Hg). (Patient not taking: No sig  reported)     hydrALAZINE (APRESOLINE) 50 MG tablet Take 1 tablet (50 mg total) by mouth 4 (four) times daily as needed (BP >160/100). (Patient not taking: Reported on 10/09/2020) 120 tablet 2   labetalol (NORMODYNE) 100 MG tablet Take 100  mg by mouth 2 (two) times daily.     rosuvastatin (CRESTOR) 20 MG tablet Take 1 tablet (20 mg total) by mouth daily. (Patient taking differently: Take 20 mg by mouth at bedtime.) 30 tablet 2   spironolactone (ALDACTONE) 50 MG tablet Take 1 tablet (50 mg total) by mouth every morning. 90 tablet 3   No facility-administered medications prior to visit.   Medication as of today:  No outpatient medications have been marked as taking for the 10/21/20 encounter (Appointment) with Adrian Prows, MD.     Radiology:   CT angiogram of the head and neck 08/28/2020: 1. 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 A focal fenestration is a less likely differential consideration. 2. Severe right vertebral artery origin stenosis. 3. Small left carotid web. This finding has been reported to increase the risk of ischemic stroke. 4. No evidence of acute intracranial abnormality.  Cardiac Studies:   Bilateral renal artery duplex 05/02/2020: 1. Elevated right renal artery peak systolic velocity with renal artery to aortic ratio measuring 2.7. Findings are suggestive of renal artery stenosis/narrowing less than 60%. 2. No evidence of any left renal artery stenosis.  3. 2.8 cm gallstone   Echocardiogram 07/09/2020: Normal LV systolic function with visual EF 60-65%. Left ventricle cavity is normal in size. Mild left ventricular hypertrophy. Normal global wall motion. Normal diastolic filling pattern, normal LAP. Mild tricuspid regurgitation. No evidence of pulmonary hypertension. No prior study for comparison.  CT angiogram of  the abdomen with contrast 07/01/2020: The CT angiogram is positive for subtle findings of fibromuscular  dysplasia of the bilateral renal arteries. This includes what  appears to be a 50% narrowing at the right renal artery origin  without significant atherosclerotic change.   Mild aortic atherosclerosis.  Aortic Atherosclerosis (ICD10-I70.0).   Coronary calcium score  07/10/2020: Coronary calcium score of 0, ascending and descending aortic measurements arenormal, Noncardiac findings include right middle lobe 2 to 3 mm nodule most likely asubpleural lymph nodes.  If high risk consider noncontrast CT in 12 months.  Renal arteriogram 08/19/2020: Right renal artery ostium has a 30% stenosis with a 10 mmHg pressure gradient.  There is appearance of beaded FMD in the right distal renal artery.  There is accessory inferior pole renal artery which is widely patent. Left renal artery has very mild beaded appearance again in the distal segment.  Left inferior pole accessory renal arteries widely patent.  Recommendation: Patient's FMD appears to be much more distal especially on the right is more prominent than the left, in view of distal renal artery involvement, potential for dissection and renal loss is high.  Potential could be to use a scoring balloon or a short chocolate balloon, not available in the hospital presently.  I will change her metoprolol to labetalol and see her back in the office and discuss options.  Selective bilateral renal arteriogram 10/07/2020: Right renal artery shows atherosclerotic 20 to 30% stenosis at the ostium.  There is mild beaded appearance of the distal radial artery suggestive of FMD.  RFR was not hemodynamically significant, no pressure gradient at all across the FMD. Left renal artery shows smooth appearance, there is mild haziness in the mid segment of the proximal left renal artery.  RFR did not reveal any hemodynamically significant pressure gradient.    Recommendation: Patient also has FMD, there is no pressure  gradient and there is no hemodynamic compromise.  Suspect patient has resistant hypertension/primary hypertension.  Patient will be discharged home today with outpatient follow-up.  EKG:   EKG 06/11/2020: Sinus rhythm with short PR interval, PR interval 110 ms.  Normal axis, no evidence of ischemia, otherwise normal EKG.     Assessment   No diagnosis found.    There are no discontinued medications.   No orders of the defined types were placed in this encounter.    Recommendations:   Angie Farrell is a 50 y.o. Caucasian female with history of hypertension, hyperlipidemia, family history of premature CAD in her mother who was a non-smoker and nondiabetic and is late 29s and early 24s years of age.    I again reviewed the results of the repeat bilateral renal arteriogram performed on 10/07/2020 and the fact that FFR was negative for hemodynamically significant stenosis, she has very mild form of FMD.  With regard to hypertension, as she is now taking labetalol and spironolactone on a daily basis, blood pressure is much improved.  She is also enrolled in our RPM, remote monitoring of blood pressure reveals good control in the morning but elevated in the evenings around 150/92 mmHg.  She has responded well previously to hydrochlorothiazide but was afraid to take it as it caused hypotension.  Advised her that we will start with 12.5 mg 1/2 tablet daily for a week and if she tolerates this to escalated to 12.5 mg in the morning and continue remote monitoring of her hypertension for now.  We could also consider increasing the dose as she tolerates this depending upon blood pressure response.  Patient also states that she had hypotensive response to carvedilol and chlorthalidone and these could be another option if blood pressure is not well controlled.  I also reviewed and I have personally discussed with Dr. Juliet Rude regarding  vertebral artery stenosis.  I am beginning to wonder whether the vertebral artery stenosis could potentially be FMD as well and whether just balloon angioplasty without stenting would be appropriate.  I will certainly forward my office note to him for evaluation and also will discuss with him personally.  With regard to hyperlipidemia, I am concerned about elevated LPA as well.  I would like to target her LDL to </= 55 if possible.  Patient aware of this.  I will check lipids in about 4 to 6 weeks.  I will see her back in 6 months for follow-up.  This was a 40-minute office visit encounter in evaluation of complex medical issues and review of charts and coordination of care.    Adrian Prows, MD, [facility] 10/21/2020, 7:30 AM Office: [phone] Fax: [phone] Pager: [phone]   CC: Dr. Gean Quint (nephrology); Ms. Windell Hummingbird, PA-C; Dr. Juliet Rude.

## 2020-11-07 LAB — BASIC METABOLIC PANEL
BUN/Creatinine Ratio: 18 (ref 9–23)
BUN: 17 mg/dL (ref 6–24)
CO2: 24 mmol/L (ref 20–29)
Calcium: 9.5 mg/dL (ref 8.7–10.2)
Chloride: 105 mmol/L (ref 96–106)
Creatinine, Ser: 0.97 mg/dL (ref 0.57–1.00)
Glucose: 86 mg/dL (ref 70–99)
Potassium: 4.4 mmol/L (ref 3.5–5.2)
Sodium: 141 mmol/L (ref 134–144)
eGFR: 71 mL/min/{1.73_m2} (ref >59)

## 2020-11-07 LAB — LIPID PANEL WITH LDL/HDL RATIO
Cholesterol, Total: 122 mg/dL (ref 100–199)
HDL: 36 mg/dL — ABNORMAL LOW (ref >39)
LDL Chol Calc (NIH): 68 mg/dL (ref 0–99)
LDL/HDL Ratio: 1.9 ratio (ref 0.0–3.2)
Triglycerides: 94 mg/dL (ref 0–149)
VLDL Cholesterol Cal: 18 mg/dL (ref 5–40)

## 2020-11-10 NOTE — Telephone Encounter (Signed)
CARE PLAN ENTRY  11/10/2020 Name: Angie Farrell MRN: [id number] DOB: [birth year]  Angie Farrell is enrolled in Remote Patient Monitoring.  Date of Enrollment: 10/09/20 Supervising physician: Adrian Prows Indication: HTN  Remote Readings: Compliant and Avg BP: 151/93, HR:62  Next scheduled OV: 04/22/2021  Pharmacist Clinical Goal(s):  Over the next 90 days, patient will demonstrate Improved medication adherence as evidenced by medication fill history Over the next 90 days, patient will demonstrate improved understanding of prescribed medications and rationale for usage as evidenced by patient teach back Over the next 90 days, patient will experience decrease in ED visits. ED visits in last 6 months = 0 Over the next 90 days, patient will not experience hospital admission. Hospital Admissions in last 6 months = 0  Interventions: Provider and Inter-disciplinary care team collaboration (see longitudinal plan of care) Comprehensive medication review performed. Discussed plans with patient for ongoing care management follow up and provided patient with direct contact information for care management team Collaboration with provider re: medication management  Patient Self Care Activities:  Self administers medications as prescribed Attends all scheduled provider appointments Performs ADL's independently Performs IADL's independently  No Known Allergies Outpatient Encounter Medications as of 10/09/2020  Medication Sig Note   acetaminophen (TYLENOL) 325 MG tablet Take 650 mg by mouth every 6 (six) hours as needed (for pain.).    ALPRAZolam (XANAX) 0.5 MG tablet Take 0.5 mg by mouth daily as needed for anxiety.    aspirin EC 81 MG tablet Take 81 mg by mouth in the morning. Swallow whole.    labetalol (NORMODYNE) 100 MG tablet Take 100 mg by mouth 2 (two) times daily.    rosuvastatin (CRESTOR) 20 MG tablet Take 1 tablet (20 mg total) by mouth daily. (Patient taking differently: Take 20 mg  by mouth at bedtime.)    spironolactone (ALDACTONE) 50 MG tablet Take 1 tablet (50 mg total) by mouth every morning.    cloNIDine (CATAPRES) 0.1 MG tablet Take 0.1 mg by mouth daily as needed (SBP > 190 mm Hg).    hydrALAZINE (APRESOLINE) 50 MG tablet Take 1 tablet (50 mg total) by mouth 4 (four) times daily as needed (BP >160/100). 10/03/2020: New medication (patient has never taken)   No facility-administered encounter medications on file as of 10/09/2020.    Hypertension   BP goal is:  <130/80  Office blood pressures are  BP Readings from Last 3 Encounters:  10/21/20 133/75  10/17/20 (!) 144/80  10/07/20 138/73    Patient is currently uncontrolled on the following medications: Spironolactone 50 mg (AM), labetalol 100 mg BID (PM,HS), HCTZ 6.25 mg (~PM)  Patient checks BP at home twice daily  Patient home BP readings are ranging: 121-186/80-105  Patient has tried  these meds in the past: Metoprolol, Olmesartan, carvedilol (hypotension), diltiazem,   We discussed diet and exercise extensively and Monitoring for s/sx of hypotension and hypertension, limiting salt intake  Plan  Continue current medications   Evening BP continues to remain elevated. Reviewed recent BP readings and lab results with pt. Pt continues to slowly titrate up her HCTZ as tolerated. Pt notes to have episodes of mild-to-moderate complains of lightheadedness within 5 -10 mins of taking labetalol. Resolves usually wihtin 30 mins. BP readings have improved significantly since switching to labetalol. Pt willing to continue taking labetalol 100 mg BID and monitoring closely for worsening symptoms of lightheadedness, dizziness, syncope, or fall symptoms. Pt tolerating new start HCTZ without any significant ADRs. Slowly titrating up  as pt noted to be extremely sensitive to antihypertensive medication changes. Has been on 1/2 tab of 12.5 mg for the past 1 week. Recommend pt take her HCTZ ~2-3 PM in order to manage evening  BP spikes. Pt agrees to continue monitoring for BP improvement and changes in her symptoms. Pt continues to work on diet and exercise goal. Working on reducing her salt and processed food intake.   ______________ Visit Information SDOH (Social Determinants of Health) assessments performed: Yes.  Ms. Atienza was given information about Remote Patient Monitoring services today including:  RPM service includes personalized support from designated clinical staff supervised by her physician, including individualized plan of care and coordination with other care providers 24/7 contact phone numbers for assistance for urgent and routine care needs. Standard insurance, coinsurance, copays and deductibles apply for principle care management only during months in which we provide at least 30 minutes of these services. Most insurances cover these services at 100%, however patients may be responsible for any copay, coinsurance and/or deductible if applicable. This service may help you avoid the need for more expensive face-to-face services. Only one practitioner may furnish and bill the service in a calendar month. The patient may stop RPM services at any time (effective at the end of the month) by phone call to the office staff.  Patient agreed to services and verbal consent obtained.   Manuela Schwartz, Pharm.D. [facility] [phone] Ext: 120

## 2020-11-12 ENCOUNTER — Other Ambulatory Visit (HOSPITAL_COMMUNITY): Payer: Self-pay | Admitting: Interventional Radiology

## 2020-11-12 ENCOUNTER — Telehealth: Payer: Self-pay | Admitting: Student

## 2020-11-12 DIAGNOSIS — G450 Vertebro-basilar artery syndrome: Secondary | ICD-10-CM

## 2020-11-12 MED ORDER — CLOPIDOGREL BISULFATE 75 MG PO TABS
75.0000 mg | ORAL_TABLET | Freq: Every day | ORAL | 6 refills | Status: DC
Start: 2020-11-12 — End: 2023-10-20

## 2020-11-12 NOTE — Telephone Encounter (Signed)
Plavix 75 mg, one tablet daily x 30 days with 6 refills e-prescribed to the patient's preferred [facility].  Soyla Dryer, [place] [phone] 11/12/2020, 11:41 AM

## 2020-11-27 ENCOUNTER — Other Ambulatory Visit (HOSPITAL_COMMUNITY): Payer: Self-pay

## 2020-12-15 ENCOUNTER — Other Ambulatory Visit (HOSPITAL_COMMUNITY): Payer: Self-pay

## 2020-12-15 ENCOUNTER — Other Ambulatory Visit (HOSPITAL_COMMUNITY)
Admission: RE | Admit: 2020-12-15 | Discharge: 2020-12-15 | Disposition: A | Discharge status: Home / Self Care | Payer: Managed Care, Other (non HMO) | Source: Ambulatory Visit | Attending: Interventional Radiology | Admitting: Interventional Radiology

## 2020-12-15 DIAGNOSIS — G450 Vertebro-basilar artery syndrome: Secondary | ICD-10-CM | POA: Diagnosis present

## 2020-12-18 ENCOUNTER — Other Ambulatory Visit: Payer: Self-pay | Admitting: Radiology

## 2020-12-18 ENCOUNTER — Encounter (HOSPITAL_COMMUNITY): Payer: Self-pay | Admitting: Interventional Radiology

## 2020-12-18 LAB — PLATELET INHIBITION P2Y12

## 2020-12-19 ENCOUNTER — Other Ambulatory Visit (HOSPITAL_COMMUNITY): Payer: Self-pay | Admitting: Interventional Radiology

## 2020-12-19 ENCOUNTER — Encounter (HOSPITAL_COMMUNITY): Payer: Self-pay | Admitting: Interventional Radiology

## 2020-12-19 NOTE — Progress Notes (Signed)
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 Last office visit 10/21/20  LABS:  P2Y12 12/15/20: 171 BMP, PT, CBC w/diff pending for day of procedure   IMAGES: IR Angio vertebral arteries 10/21/20: - Severe high-grade stenosis at the origin of the right vertebral artery with slow ascent of contrast to the cranial skull base. - No angiographic evidence of stenosis, or of an intimal flap, or of intraluminal filling defect is seen of the right vertebral artery at the level of C5. - Mild fusiform dilatation of the right internal carotid artery at the cranial skull base scar most likely sequelae of chronic hypertension.   Selective bilateral renal arteriogram 10/07/2020: Right renal artery shows atherosclerotic 20 to 30% stenosis at the ostium.  There is mild beaded appearance of the distal radial artery suggestive of FMD.  RFR was not hemodynamically significant, no pressure gradient at all across the FMD. Left renal artery shows smooth appearance, there is mild haziness in the mid segment of the proximal left renal artery.  RFR did not reveal any hemodynamically significant pressure gradient.   Renal arteriogram 08/19/2020: Right renal artery ostium has a 30% stenosis with a 10 mmHg pressure gradient.  There is appearance of beaded FMD in the right distal renal artery.  There is accessory inferior pole renal artery which is widely patent. Left renal artery has very mild beaded appearance again in the distal segment.  Left inferior pole accessory renal arteries widely patent. - Recommendation:  Patient's FMD appears to be much more distal especially on the right is more prominent than the left, in view of distal renal artery involvement, potential for dissection and renal loss is high.  Potential could be to use a scoring balloon or a short chocolate balloon, not available in the hospital presently.  I will change her metoprolol to labetalol and see her back in the office and discuss options.  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 This includes what  appears to be a 50% narrowing at the right renal artery origin  without significant atherosclerotic change.  Mild aortic atherosclerosis.  Aortic Atherosclerosis   EKG 06/11/20: Sinus rhythm with short PR interval, PR interval 110 ms.  Normal axis, no evidence of ischemia, otherwise normal EKG   CV: Coronary calcium score  07/10/2020: Coronary calcium score of 0, ascending and descending aortic measurements arenormal, Noncardiac findings include right middle lobe 2 to 3 mm nodule most likely asubpleural lymph nodes.  If high risk consider noncontrast CT in 12 months.  Echocardiogram 07/09/2020: Normal LV systolic function with visual EF 60-65%. Left ventricle cavity is normal in size. Mild left ventricular hypertrophy. Normal global wall motion. Normal diastolic filling pattern, normal LAP. Mild tricuspid regurgitation. No evidence of pulmonary hypertension. No prior study for comparison.  Bilateral renal artery duplex 05/02/2020: 1. Elevated right renal artery peak systolic velocity with renal artery to aortic ratio measuring 2.7. Findings are suggestive of renal artery stenosis/narrowing less  than 60%. 2. No evidence of any left renal artery stenosis.  3. 2.8 cm gallstone    Past Medical History:  Diagnosis Date   Allergy    Cancer ([place])    basal cell near collarbone   Ectopic cardiac rhythm    Essential  hypertension 06/14/2017   Family history of premature CAD 06/14/2017   H/O diverticulitis of colon    Headache    History of kidney stones    hx of 8 stones   History of panic attacks 06/14/2017   Perennial allergic rhinitis 06/14/2017   PONV (postoperative nausea and vomiting)     Past Surgical History:  Procedure Laterality Date   ABDOMINAL AORTOGRAM N/A 08/19/2020   Procedure: ABDOMINAL AORTOGRAM;  Surgeon: Adrian Prows, MD;  Location: [place] [facility];  Service: Cardiovascular;  Laterality: N/A;   INTRAVASCULAR PRESSURE WIRE/FFR STUDY Bilateral 10/07/2020   Procedure: INTRAVASCULAR PRESSURE WIRE/FFR STUDY;  Surgeon: Adrian Prows, MD;  Location: [facility];  Service: Cardiovascular;  Laterality: Bilateral;  RENAL ARTERIES   IR ANGIO INTRA EXTRACRAN SEL COM CAROTID INNOMINATE BILAT MOD SED  10/17/2020   IR ANGIO VERTEBRAL SEL VERTEBRAL BILAT MOD SED  10/17/2020   IR RADIOLOGIST EVAL & MGMT  10/06/2020   IR US GUIDE VASC ACCESS RIGHT  10/17/2020   LITHOTRIPSY     X 3   RENAL ANGIOGRAPHY N/A 08/19/2020   Procedure: RENAL ANGIOGRAPHY;  Surgeon: Adrian Prows, MD;  Location: [facility];  Service: Cardiovascular;  Laterality: N/A;   RENAL ANGIOGRAPHY N/A 10/07/2020   Procedure: RENAL ANGIOGRAPHY;  Surgeon: Adrian Prows, MD;  Location: [facility];  Service: Cardiovascular;  Laterality: N/A;   TONSILLECTOMY  1981    MEDICATIONS: No current facility-administered medications for this encounter.    acetaminophen (TYLENOL) 500 MG tablet   ALPRAZolam (XANAX) 0.5 MG tablet   aspirin EC 81 MG tablet   clopidogrel (PLAVIX) 75 MG tablet   hydrochlorothiazide (HYDRODIURIL) 12.5 MG tablet   labetalol (NORMODYNE) 100 MG tablet   rosuvastatin (CRESTOR) 20 MG tablet   spironolactone (ALDACTONE) 50 MG tablet   cloNIDine (CATAPRES) 0.1 MG tablet   hydrALAZINE (APRESOLINE) 50 MG tablet    If labs acceptable day of surgery, I anticipate pt can proceed with surgery as scheduled.  Willeen Cass, PhD, FNP-BC [facility]/Anesthesiology Phone: [phone] 12/19/2020 11:04 AM

## 2020-12-19 NOTE — Progress Notes (Signed)
Spoke with pt for pre-op call. Pt denies cardiac history and is not diabetic. Pt is treated for HTN.   Covid test to be done today.

## 2020-12-19 NOTE — Anesthesia Preprocedure Evaluation (Addendum)
Anesthesia Evaluation  Patient identified by MRN, date of birth, ID band Patient awake    Reviewed: Allergy & Precautions, NPO status , Patient's Chart, lab work & pertinent test results, reviewed documented beta blocker date and time   History of Anesthesia Complications (+) PONV and history of anesthetic complications  Airway Mallampati: II  TM Distance: >3 FB Neck ROM: Full    Dental no notable dental hx. (+) Teeth Intact, Dental Advisory Given   Pulmonary neg pulmonary ROS,    Pulmonary exam normal breath sounds clear to auscultation       Cardiovascular hypertension (192/105 in preop, normally 180 SBP meds are currently being titrated w/ cardiology), Pt. on medications and Pt. 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 -No angiographic evidence of stenosis, or of an intimal flap, or of intraluminal filling defect is seen of the right vertebral artery at the level of C5. - Mild fusiform dilatation of the right internal carotid artery at the cranial skull base scar most likely sequelae of chronic hypertension. negative psych ROS   GI/Hepatic negative GI ROS, Neg liver ROS,   Endo/Other  negative endocrine ROS  Renal/GU negative Renal ROS  negative genitourinary   Musculoskeletal negative musculoskeletal ROS (+)   Abdominal   Peds  Hematology negative hematology ROS (+)   Anesthesia Other Findings   Reproductive/Obstetrics negative OB ROS                          Anesthesia Physical Anesthesia Plan  ASA: 3  Anesthesia Plan: MAC   Post-op Pain Management:     Induction: Intravenous  PONV Risk Score and Plan: 4 or greater and Ondansetron, Dexamethasone, Midazolam, Treatment may vary due to age or medical condition, Scopolamine patch - Pre-op, Propofol infusion and TIVA  Airway Management Planned: Oral ETT  Additional Equipment: Arterial line  Intra-op Plan:   Post-operative Plan:   Informed Consent: I have reviewed the patients History and Physical, chart, labs and discussed the procedure including the risks, benefits and alternatives for the proposed anesthesia with the patient or authorized representative who has indicated his/her understanding and acceptance.     Dental advisory given  Plan Discussed with: CRNA  Anesthesia Plan Comments: (Per Dr. Patrecia Pour wishes to attempt MAC, will convert to [place] if needed)     Anesthesia Quick Evaluation

## 2020-12-20 LAB — SARS CORONAVIRUS 2 (TAT 6-24 HRS): SARS Coronavirus 2: NEGATIVE

## 2020-12-22 ENCOUNTER — Ambulatory Visit (HOSPITAL_COMMUNITY): Payer: Managed Care, Other (non HMO)

## 2020-12-22 ENCOUNTER — Ambulatory Visit (HOSPITAL_COMMUNITY): Payer: Managed Care, Other (non HMO) | Admitting: Emergency Medicine

## 2020-12-22 ENCOUNTER — Other Ambulatory Visit: Payer: Self-pay

## 2020-12-22 ENCOUNTER — Encounter (HOSPITAL_COMMUNITY): Payer: Self-pay | Admitting: Interventional Radiology

## 2020-12-22 ENCOUNTER — Inpatient Hospital Stay (HOSPITAL_COMMUNITY)
Admission: RE | Admit: 2020-12-22 | Discharge: 2020-12-22 | Disposition: A | Discharge status: Home / Self Care | Payer: Managed Care, Other (non HMO) | Source: Ambulatory Visit | Attending: Interventional Radiology | Admitting: Interventional Radiology

## 2020-12-22 ENCOUNTER — Encounter (HOSPITAL_COMMUNITY)
Admission: RE | Disposition: A | Discharge status: Home / Self Care | Payer: Self-pay | Source: Home / Self Care | Attending: Interventional Radiology

## 2020-12-22 ENCOUNTER — Inpatient Hospital Stay (HOSPITAL_COMMUNITY)
Admission: RE | Admit: 2020-12-22 | Discharge: 2020-12-23 | DRG: 038 | Disposition: A | Discharge status: Home / Self Care | Payer: Managed Care, Other (non HMO) | Attending: Interventional Radiology | Admitting: Interventional Radiology

## 2020-12-22 DIAGNOSIS — Z7902 Long term (current) use of antithrombotics/antiplatelets: Secondary | ICD-10-CM

## 2020-12-22 DIAGNOSIS — Z7982 Long term (current) use of aspirin: Secondary | ICD-10-CM

## 2020-12-22 DIAGNOSIS — Z79899 Other long term (current) drug therapy: Secondary | ICD-10-CM | POA: Diagnosis not present

## 2020-12-22 DIAGNOSIS — R29702 NIHSS score 2: Secondary | ICD-10-CM | POA: Diagnosis present

## 2020-12-22 DIAGNOSIS — Z8249 Family history of ischemic heart disease and other diseases of the circulatory system: Secondary | ICD-10-CM | POA: Diagnosis not present

## 2020-12-22 DIAGNOSIS — Y838 Other surgical procedures as the cause of abnormal reaction of the patient, or of later complication, without mention of misadventure at the time of the procedure: Secondary | ICD-10-CM | POA: Diagnosis present

## 2020-12-22 DIAGNOSIS — I63111 Cerebral infarction due to embolism of right vertebral artery: Principal | ICD-10-CM | POA: Diagnosis present

## 2020-12-22 DIAGNOSIS — I773 Arterial fibromuscular dysplasia: Secondary | ICD-10-CM | POA: Diagnosis present

## 2020-12-22 DIAGNOSIS — R4781 Slurred speech: Secondary | ICD-10-CM | POA: Diagnosis present

## 2020-12-22 DIAGNOSIS — G7102 Facioscapulohumeral muscular dystrophy: Secondary | ICD-10-CM

## 2020-12-22 DIAGNOSIS — I10 Essential (primary) hypertension: Secondary | ICD-10-CM | POA: Diagnosis present

## 2020-12-22 DIAGNOSIS — Z8349 Family history of other endocrine, nutritional and metabolic diseases: Secondary | ICD-10-CM

## 2020-12-22 DIAGNOSIS — I708 Atherosclerosis of other arteries: Secondary | ICD-10-CM | POA: Diagnosis present

## 2020-12-22 DIAGNOSIS — R0789 Other chest pain: Secondary | ICD-10-CM | POA: Diagnosis not present

## 2020-12-22 DIAGNOSIS — Z831 Family history of other infectious and parasitic diseases: Secondary | ICD-10-CM

## 2020-12-22 DIAGNOSIS — I959 Hypotension, unspecified: Secondary | ICD-10-CM | POA: Diagnosis not present

## 2020-12-22 DIAGNOSIS — G450 Vertebro-basilar artery syndrome: Secondary | ICD-10-CM

## 2020-12-22 DIAGNOSIS — G459 Transient cerebral ischemic attack, unspecified: Secondary | ICD-10-CM | POA: Diagnosis not present

## 2020-12-22 DIAGNOSIS — I6501 Occlusion and stenosis of right vertebral artery: Secondary | ICD-10-CM | POA: Diagnosis present

## 2020-12-22 DIAGNOSIS — G8194 Hemiplegia, unspecified affecting left nondominant side: Secondary | ICD-10-CM | POA: Diagnosis present

## 2020-12-22 DIAGNOSIS — R0600 Dyspnea, unspecified: Secondary | ICD-10-CM | POA: Diagnosis not present

## 2020-12-22 DIAGNOSIS — Z801 Family history of malignant neoplasm of trachea, bronchus and lung: Secondary | ICD-10-CM

## 2020-12-22 DIAGNOSIS — I97821 Postprocedural cerebrovascular infarction during other surgery: Secondary | ICD-10-CM | POA: Diagnosis present

## 2020-12-22 DIAGNOSIS — I2510 Atherosclerotic heart disease of native coronary artery without angina pectoris: Secondary | ICD-10-CM | POA: Diagnosis present

## 2020-12-22 DIAGNOSIS — I6509 Occlusion and stenosis of unspecified vertebral artery: Secondary | ICD-10-CM | POA: Insufficient documentation

## 2020-12-22 DIAGNOSIS — Z87442 Personal history of urinary calculi: Secondary | ICD-10-CM

## 2020-12-22 DIAGNOSIS — Z85828 Personal history of other malignant neoplasm of skin: Secondary | ICD-10-CM

## 2020-12-22 HISTORY — DX: Headache, unspecified: R51.9

## 2020-12-22 HISTORY — PX: RADIOLOGY WITH ANESTHESIA: SHX6223

## 2020-12-22 HISTORY — PX: IR TRANSCATH EXCRAN VERT OR CAR A STENT: IMG1955

## 2020-12-22 HISTORY — DX: Other specified postprocedural states: Z98.890

## 2020-12-22 HISTORY — DX: Malignant (primary) neoplasm, unspecified: C80.1

## 2020-12-22 HISTORY — DX: Personal history of urinary calculi: Z87.442

## 2020-12-22 HISTORY — DX: Nausea with vomiting, unspecified: R11.2

## 2020-12-22 HISTORY — PX: IR ANGIO VERTEBRAL SEL VERTEBRAL UNI R MOD SED: IMG5368

## 2020-12-22 LAB — POCT ACTIVATED CLOTTING TIME
Activated Clotting Time: 233 seconds
Activated Clotting Time: 239 seconds

## 2020-12-22 LAB — CBC WITH DIFFERENTIAL/PLATELET
Abs Immature Granulocytes: 0.02 10*3/uL (ref 0.00–0.07)
Basophils Absolute: 0.1 10*3/uL (ref 0.0–0.1)
Basophils Relative: 1 %
Eosinophils Absolute: 0.2 10*3/uL (ref 0.0–0.5)
Eosinophils Relative: 3 %
HCT: 39.6 % (ref 36.0–46.0)
Hemoglobin: 13.3 g/dL (ref 12.0–15.0)
Immature Granulocytes: 0 %
Lymphocytes Relative: 34 %
Lymphs Abs: 2.8 10*3/uL (ref 0.7–4.0)
MCH: 32.9 pg (ref 26.0–34.0)
MCHC: 33.6 g/dL (ref 30.0–36.0)
MCV: 98 fL (ref 80.0–100.0)
Monocytes Absolute: 0.5 10*3/uL (ref 0.1–1.0)
Monocytes Relative: 6 %
Neutro Abs: 4.8 10*3/uL (ref 1.7–7.7)
Neutrophils Relative %: 56 %
Platelets: 286 10*3/uL (ref 150–400)
RBC: 4.04 MIL/uL (ref 3.87–5.11)
RDW: 11.3 % — ABNORMAL LOW (ref 11.5–15.5)
WBC: 8.4 10*3/uL (ref 4.0–10.5)
nRBC: 0 % (ref 0.0–0.2)

## 2020-12-22 LAB — MRSA NEXT GEN BY PCR, NASAL: MRSA by PCR Next Gen: NOT DETECTED

## 2020-12-22 LAB — BASIC METABOLIC PANEL
Anion gap: 6 (ref 5–15)
BUN: 12 mg/dL (ref 6–20)
CO2: 22 mmol/L (ref 22–32)
Calcium: 7.9 mg/dL — ABNORMAL LOW (ref 8.9–10.3)
Chloride: 111 mmol/L (ref 98–111)
Creatinine, Ser: 0.68 mg/dL (ref 0.44–1.00)
GFR, Estimated: >60 mL/min (ref >60)
Glucose, Bld: 89 mg/dL (ref 70–99)
Potassium: 3.4 mmol/L — ABNORMAL LOW (ref 3.5–5.1)
Sodium: 139 mmol/L (ref 135–145)

## 2020-12-22 LAB — PROTIME-INR
INR: 0.9 (ref 0.8–1.2)
Prothrombin Time: 12.5 seconds (ref 11.4–15.2)

## 2020-12-22 LAB — GLUCOSE, CAPILLARY
Glucose-Capillary: 136 mg/dL — ABNORMAL HIGH (ref 70–99)
Glucose-Capillary: 69 mg/dL — ABNORMAL LOW (ref 70–99)

## 2020-12-22 LAB — HEPARIN LEVEL (UNFRACTIONATED): Heparin Unfractionated: 0.28 IU/mL — ABNORMAL LOW (ref 0.30–0.70)

## 2020-12-22 SURGERY — IR WITH ANESTHESIA
Anesthesia: Monitor Anesthesia Care

## 2020-12-22 MED ORDER — IOHEXOL 300 MG/ML  SOLN
100.0000 mL | Freq: Once | INTRAMUSCULAR | Status: AC | PRN
  Administered 2020-12-22: 30 mL via INTRA_ARTERIAL

## 2020-12-22 MED ORDER — CLEVIDIPINE BUTYRATE 0.5 MG/ML IV EMUL
0.0000 mg/h | INTRAVENOUS | Status: DC
  Administered 2020-12-22: 7 mg/h via INTRAVENOUS
  Administered 2020-12-22: 6 mg/h via INTRAVENOUS
  Administered 2020-12-23: 4 mg/h via INTRAVENOUS
  Filled 2020-12-22 (×3): qty 50

## 2020-12-22 MED ORDER — MIDAZOLAM HCL 5 MG/5ML IJ SOLN
INTRAMUSCULAR | Status: DC | PRN
  Administered 2020-12-22 (×2): 1 mg via INTRAVENOUS

## 2020-12-22 MED ORDER — CHLORHEXIDINE GLUCONATE 0.12 % MT SOLN
OROMUCOSAL | Status: AC
  Administered 2020-12-22: 15 mL via OROMUCOSAL
  Filled 2020-12-22: qty 15

## 2020-12-22 MED ORDER — CHLORHEXIDINE GLUCONATE 0.12 % MT SOLN: 15.0000 mL | Freq: Once | OROMUCOSAL | Status: AC

## 2020-12-22 MED ORDER — CLOPIDOGREL BISULFATE 75 MG PO TABS
75.0000 mg | ORAL_TABLET | ORAL | Status: AC
  Administered 2020-12-22: 75 mg via ORAL
  Filled 2020-12-22: qty 1

## 2020-12-22 MED ORDER — LIDOCAINE HCL 1 % IJ SOLN
INTRAMUSCULAR | Status: AC
  Filled 2020-12-22: qty 20

## 2020-12-22 MED ORDER — IOHEXOL 300 MG/ML  SOLN
100.0000 mL | Freq: Once | INTRAMUSCULAR | Status: AC | PRN
  Administered 2020-12-22: 60 mL via INTRA_ARTERIAL

## 2020-12-22 MED ORDER — IOHEXOL 350 MG/ML SOLN
75.0000 mL | Freq: Once | INTRAVENOUS | Status: AC | PRN
  Administered 2020-12-22: 75 mL via INTRAVENOUS

## 2020-12-22 MED ORDER — NITROGLYCERIN 1 MG/10 ML FOR IR/CATH LAB
INTRA_ARTERIAL | Status: AC
  Filled 2020-12-22: qty 10

## 2020-12-22 MED ORDER — CLEVIDIPINE BUTYRATE 0.5 MG/ML IV EMUL
INTRAVENOUS | Status: DC | PRN
  Administered 2020-12-22: 2 mg/h via INTRAVENOUS

## 2020-12-22 MED ORDER — PHENYLEPHRINE HCL-NACL 20-0.9 MG/250ML-% IV SOLN: 25.0000 ug/min | INTRAVENOUS | Status: DC

## 2020-12-22 MED ORDER — DEXTROSE 50 % IV SOLN: 25.0000 mL | Freq: Once | INTRAVENOUS | Status: AC

## 2020-12-22 MED ORDER — HEPARIN SODIUM (PORCINE) 1000 UNIT/ML IJ SOLN
INTRAMUSCULAR | Status: DC | PRN
  Administered 2020-12-22: 3000 [IU] via INTRAVENOUS

## 2020-12-22 MED ORDER — VERAPAMIL HCL 2.5 MG/ML IV SOLN
INTRAVENOUS | Status: AC
  Filled 2020-12-22: qty 2

## 2020-12-22 MED ORDER — ONDANSETRON HCL 4 MG/2ML IJ SOLN
INTRAMUSCULAR | Status: DC | PRN
  Administered 2020-12-22: 4 mg via INTRAVENOUS

## 2020-12-22 MED ORDER — ASPIRIN EC 325 MG PO TBEC: 325.0000 mg | DELAYED_RELEASE_TABLET | ORAL | Status: DC

## 2020-12-22 MED ORDER — ACETAMINOPHEN 160 MG/5ML PO SOLN: 650.0000 mg | ORAL | Status: DC | PRN

## 2020-12-22 MED ORDER — HEPARIN (PORCINE) 25000 UT/250ML-% IV SOLN: 450.0000 [IU]/h | INTRAVENOUS | Status: DC

## 2020-12-22 MED ORDER — CHLORHEXIDINE GLUCONATE CLOTH 2 % EX PADS: 6.0000 | MEDICATED_PAD | Freq: Every day | CUTANEOUS | Status: DC

## 2020-12-22 MED ORDER — SCOPOLAMINE 1 MG/3DAYS TD PT72
1.0000 | MEDICATED_PATCH | TRANSDERMAL | Status: DC
  Administered 2020-12-22: 1.5 mg via TRANSDERMAL
  Filled 2020-12-22: qty 1

## 2020-12-22 MED ORDER — SODIUM CHLORIDE 0.9 % IV SOLN: INTRAVENOUS | Status: DC

## 2020-12-22 MED ORDER — NIMODIPINE 30 MG PO CAPS: 0.0000 mg | ORAL_CAPSULE | ORAL | Status: DC

## 2020-12-22 MED ORDER — PROTAMINE SULFATE 10 MG/ML IV SOLN
INTRAVENOUS | Status: DC | PRN
  Administered 2020-12-22: 5 mg via INTRAVENOUS

## 2020-12-22 MED ORDER — SODIUM CHLORIDE 0.9 % IV SOLN: 250.0000 mL | INTRAVENOUS | Status: DC

## 2020-12-22 MED ORDER — IOHEXOL 300 MG/ML  SOLN: 100.0000 mL | Freq: Once | INTRAMUSCULAR | Status: DC | PRN

## 2020-12-22 MED ORDER — HEPARIN SODIUM (PORCINE) 1000 UNIT/ML IJ SOLN
INTRAMUSCULAR | Status: AC
  Filled 2020-12-22: qty 10

## 2020-12-22 MED ORDER — SODIUM CHLORIDE 0.9 % IV BOLUS
500.0000 mL | Freq: Once | INTRAVENOUS | Status: AC
  Administered 2020-12-22: 500 mL via INTRAVENOUS

## 2020-12-22 MED ORDER — CLOPIDOGREL BISULFATE 75 MG PO TABS: 75.0000 mg | ORAL_TABLET | Freq: Every day | ORAL | Status: DC

## 2020-12-22 MED ORDER — EPTIFIBATIDE 20 MG/10ML IV SOLN
INTRAVENOUS | Status: AC
  Filled 2020-12-22: qty 10

## 2020-12-22 MED ORDER — PROPOFOL 500 MG/50ML IV EMUL
INTRAVENOUS | Status: DC | PRN
  Administered 2020-12-22: 100 ug/kg/min via INTRAVENOUS

## 2020-12-22 MED ORDER — PHENYLEPHRINE HCL-NACL 20-0.9 MG/250ML-% IV SOLN
INTRAVENOUS | Status: DC | PRN
  Administered 2020-12-22: 20 ug/min via INTRAVENOUS

## 2020-12-22 MED ORDER — HEPARIN (PORCINE) 25000 UT/250ML-% IV SOLN
500.0000 [IU]/h | INTRAVENOUS | Status: DC
  Administered 2020-12-22: 500 [IU]/h via INTRAVENOUS
  Filled 2020-12-22: qty 250

## 2020-12-22 MED ORDER — FENTANYL CITRATE (PF) 250 MCG/5ML IJ SOLN
INTRAMUSCULAR | Status: DC | PRN
  Administered 2020-12-22: 50 ug via INTRAVENOUS

## 2020-12-22 MED ORDER — ASPIRIN 81 MG PO CHEW: 81.0000 mg | CHEWABLE_TABLET | Freq: Every day | ORAL | Status: DC

## 2020-12-22 MED ORDER — ACETAMINOPHEN 325 MG PO TABS
650.0000 mg | ORAL_TABLET | ORAL | Status: DC | PRN
  Administered 2020-12-22 – 2020-12-23 (×2): 650 mg via ORAL
  Filled 2020-12-22 (×2): qty 2

## 2020-12-22 MED ORDER — EPTIFIBATIDE 20 MG/10ML IV SOLN
INTRAVENOUS | Status: DC | PRN
  Administered 2020-12-22 (×2): 1.5 mg via INTRA_ARTERIAL

## 2020-12-22 MED ORDER — CEFAZOLIN SODIUM-DEXTROSE 2-4 GM/100ML-% IV SOLN
INTRAVENOUS | Status: AC
  Filled 2020-12-22: qty 100

## 2020-12-22 MED ORDER — CEFAZOLIN SODIUM-DEXTROSE 2-4 GM/100ML-% IV SOLN
2.0000 g | INTRAVENOUS | Status: AC
  Administered 2020-12-22: 2 g via INTRAVENOUS

## 2020-12-22 MED ORDER — CLOPIDOGREL BISULFATE 75 MG PO TABS
75.0000 mg | ORAL_TABLET | Freq: Every day | ORAL | Status: DC
  Administered 2020-12-23: 75 mg via ORAL
  Filled 2020-12-22: qty 1

## 2020-12-22 MED ORDER — ASPIRIN 81 MG PO CHEW
81.0000 mg | CHEWABLE_TABLET | Freq: Every day | ORAL | Status: DC
  Administered 2020-12-23: 81 mg via ORAL
  Filled 2020-12-22: qty 1

## 2020-12-22 MED ORDER — DEXTROSE 50 % IV SOLN
INTRAVENOUS | Status: AC
  Administered 2020-12-22: 25 mL via INTRAVENOUS
  Filled 2020-12-22: qty 50

## 2020-12-22 MED ORDER — LIDOCAINE HCL (PF) 1 % IJ SOLN
INTRAMUSCULAR | Status: DC | PRN
  Administered 2020-12-22: 10 mL

## 2020-12-22 MED ORDER — SPIRONOLACTONE 25 MG PO TABS
50.0000 mg | ORAL_TABLET | ORAL | Status: AC
  Administered 2020-12-22: 50 mg via ORAL
  Filled 2020-12-22: qty 2
  Filled 2020-12-22: qty 1

## 2020-12-22 MED ORDER — ACETAMINOPHEN 650 MG RE SUPP: 650.0000 mg | RECTAL | Status: DC | PRN

## 2020-12-22 NOTE — H&P (Addendum)
Chief Complaint: Right vertebral artery stenosis. Request is for cerebral and vertebral angiogram with intervention.   Supervising Physician: Luanne Bras  Patient Status: [facility] - Out-pt  History of Present Illness: Angie Farrell is a 50 y.o. female 50 y.o. female outpatient. 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 A focal fenestration is a less likely differential consideration. Severe right vertebral artery origin stenosis. Small left carotid web. This finding has been reported to increase the risk of ischemic stroke. Patient was seen in [facility] clinic on 9.16.22 with Dr. Estanislado Pandy where the patient agreed to procedure with an cerebral arteriogram. 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 Patient presents for endovascular revascularization.  Currently without any significant complaints. Patient alert and laying in bed, calm and comfortable. Denies any fevers, headache, chest pain, SOB, cough, abdominal pain, nausea, vomiting or bleeding. Return precautions and treatment recommendations and follow-up discussed with the patient who is agreeable with the plan.    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   Ectopic cardiac rhythm    Essential hypertension 06/14/2017   Family history of premature CAD 06/14/2017   H/O diverticulitis of colon    Headache    History of kidney stones    hx of 8 stones   History of panic attacks 06/14/2017   Perennial allergic  rhinitis 06/14/2017   PONV (postoperative nausea and vomiting)     Past Surgical History:  Procedure Laterality Date   ABDOMINAL AORTOGRAM N/A 08/19/2020   Procedure: ABDOMINAL AORTOGRAM;  Surgeon: Adrian Prows, MD;  Location: [place] [facility];  Service: Cardiovascular;  Laterality: N/A;   INTRAVASCULAR PRESSURE WIRE/FFR STUDY Bilateral 10/07/2020   Procedure: INTRAVASCULAR PRESSURE WIRE/FFR STUDY;  Surgeon: Adrian Prows, MD;  Location: [place] [facility];  Service: Cardiovascular;  Laterality: Bilateral;  RENAL ARTERIES   IR ANGIO INTRA EXTRACRAN SEL COM CAROTID INNOMINATE BILAT MOD SED  10/17/2020   IR ANGIO VERTEBRAL SEL VERTEBRAL BILAT MOD SED  10/17/2020   IR RADIOLOGIST EVAL & MGMT  10/06/2020   IR US GUIDE VASC ACCESS RIGHT  10/17/2020   LITHOTRIPSY     X 3   RENAL ANGIOGRAPHY N/A 08/19/2020   Procedure: RENAL ANGIOGRAPHY;  Surgeon: Adrian Prows, MD;  Location: [place] [facility];  Service: Cardiovascular;  Laterality: N/A;   RENAL ANGIOGRAPHY N/A 10/07/2020   Procedure: RENAL ANGIOGRAPHY;  Surgeon: Adrian Prows, MD;  Location: [place] [facility];  Service: Cardiovascular;  Laterality: N/A;   TONSILLECTOMY  1981    Allergies: Patient has no known allergies.  Medications: Prior to Admission medications   Medication Sig Start Date End Date Taking? Authorizing Provider  acetaminophen (TYLENOL) 500 MG tablet Take 1,000 mg by mouth every 6 (six) hours as needed for moderate pain.   Yes [provider]  aspirin EC 81 MG tablet Take 81 mg by mouth in the morning. Swallow whole.   Yes [provider]  clopidogrel (PLAVIX) 75 MG tablet Take 1 tablet (75 mg  total) by mouth daily. 11/12/20  Yes C[place]ton, Roselyn Reef R, NP  hydrochlorothiazide (HYDRODIURIL) 12.5 MG tablet Take 4 tablets (50 mg total) by mouth every morning. Patient taking differently: Take 6.25 mg by mouth daily as needed (sbp 180 or higher). 10/21/20  Yes Adrian Prows, MD  labetalol (NORMODYNE) 100 MG tablet Take 100 mg by  mouth 2 (two) times daily.   Yes [provider]  rosuvastatin (CRESTOR) 20 MG tablet Take 1 tablet (20 mg total) by mouth daily. Patient taking differently: Take 20 mg by mouth at bedtime. 07/23/20 12/17/20 Yes Adrian Prows, MD  spironolactone (ALDACTONE) 50 MG tablet Take 1 tablet (50 mg total) by mouth every morning. 10/07/20  Yes Adrian Prows, MD  ALPRAZolam Duanne Moron) 0.5 MG tablet Take 0.5 mg by mouth daily as needed for anxiety.    [provider]  cloNIDine (CATAPRES) 0.1 MG tablet Take 0.1 mg by mouth daily as needed (SBP > 180 mm Hg).    [provider]  hydrALAZINE (APRESOLINE) 50 MG tablet Take 1 tablet (50 mg total) by mouth 4 (four) times daily as needed (BP >160/100). Patient not taking: Reported on 12/17/2020 09/01/20 12/17/20  Adrian Prows, MD     Family History  Problem Relation Age of Onset   Hypertension Mother    Heart disease Father 78   Hypertension Father    Tuberculosis Father    Heart attack Father    Hypertension Brother    Healthy Daughter    Drug abuse Son        heroine   Lung cancer Maternal Grandmother    Heart disease Maternal Grandmother 3   Heart attack Maternal Grandmother    Hearing loss Sister    Hyperlipidemia Sister    Hypertension Sister     Social History   Socioeconomic History   Marital status: Married    Spouse name: Not on file   Number of children: 2   Years of education: Not on file   Highest education level: Not on file  Occupational History   Occupation: Electronics engineer, 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    Husband works for [employer]   2 grown children; live in [place] Strain: Not on [employer] Insecurity: Not on file  Transportation Needs: Not on file  Physical Activity: Not on file  Stress: Not on file  Social Connections: Not on file    Review of Systems: A 12 point ROS discussed and pertinent positives are indicated in the HPI above.  All other systems are negative.  Review of Systems  Constitutional:  Negative for fatigue and fever.  HENT:  Negative for congestion.   Respiratory:  Negative for cough and shortness of breath.   Gastrointestinal:  Negative for abdominal pain, diarrhea, nausea and vomiting.   Vital Signs: BP (!) 192/105   Pulse 64   Temp 97.8 F (36.6 C) (Oral)   Resp 17  Ht 5\' 3"  (1.6 m)   Wt 128 lb (58.1 kg)   SpO2 99%   BMI 22.67 kg/m   Physical Exam Vitals and nursing note reviewed.  Constitutional:      Appearance: She is well-developed.  HENT:     Head: Normocephalic and atraumatic.  Eyes:     Conjunctiva/sclera: Conjunctivae normal.  Pulmonary:     Effort: Pulmonary effort is normal.  Musculoskeletal:        General: Normal range of motion.     Cervical back: Normal range of motion.  Skin:    General: Skin is warm.  Neurological:     Mental Status: She is alert and oriented to person, place, and time.    Imaging: No results found.  Labs:  CBC: Recent Labs    08/13/20 0939 10/07/20 0541 10/17/20 0821 12/22/20 0645  WBC 7.8 9.0 7.5 8.4  HGB 13.5 12.8 13.1 13.3  HCT 41.0 38.3 39.0 39.6  PLT 247 252 290 286    COAGS: Recent Labs    10/17/20 0821 12/22/20 0645  INR 1.0 0.9  APTT 28  --     BMP: Recent Labs    08/13/20 0939 10/02/20 0822 10/17/20 0821 11/06/20 0820  NA 141 140 138 141  K 4.2 4.6 4.0 4.4  CL 103 103 106 105  CO2 22 22 26 24   GLUCOSE 83 91 96 86  BUN 12 10 11 17   CALCIUM 9.4 9.1 9.3 9.5  CREATININE 0.95 0.88 0.92  0.97  GFRNONAA  --   --  >60  --     LIVER FUNCTION TESTS: No results for input(s): BILITOT, AST, ALT, ALKPHOS, PROT, ALBUMIN in the last 8760 hours.    Assessment and Plan:  50 y.o. female outpatient. 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 A focal fenestration is a less likely differential consideration. Severe right vertebral artery origin stenosis. Small left carotid web. This finding has been reported to increase the risk of ischemic stroke. Patient was seen in [facility] clinic on 9.16.22 with Dr. Estanislado Pandy where the patient agreed to procedure with an cerebral arteriogram. 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 Patient presents for endovascular revascularization.   All labs and medication are within acceptable parameters. NKDA. Patient has been NPO since midnight.  Risks and benefits of cerebral and vertebral arteriogram with intervention were discussed with the patient including, but not limited to bleeding, infection, vascular injury, contrast induced renal failure, stroke, reperfusion hemorrhage, or even death. This interventional procedure involves the use of X-rays and because of the nature of the planned procedure, it is possible that we will have prolonged use of X-ray fluoroscopy. Potential radiation risks to you include (but are not limited to) the following: - A slightly elevated risk for cancer  several years later in life. This risk is typically less than 0.5% percent. This risk is low in comparison to  the normal incidence of human cancer, which is 33% for women and 50% for men according to the [place]. - Radiation induced injury can include skin  redness, resembling a rash, tissue breakdown / ulcers and hair loss (which can be temporary or permanent).  The likelihood of either of these occurring depends on the difficulty of the procedure and whether you are sensitive to radiation due to previous procedures, disease, or genetic conditions.  IF your procedure requires a prolonged use of radiation, you will be notified and given written instructions for further action.  It is your responsibility to monitor the irradiated area for the 2 weeks following the procedure and to notify your physician if you are concerned that you have suffered a radiation induced injury.   All of the patient's questions were answered, patient is agreeable to proceed. Consent signed and in chart.    Thank you for this interesting consult.  I greatly enjoyed meeting Angie Farrell and look forward to participating in their care.  A copy of this report was sent to the requesting provider on this date.  Electronically Signed: Jacqualine Mau, NP 12/22/2020, 8:07 AM   I spent a total of  40 Minutes   in face to face in clinical consultation, greater than 50% of which was counseling/coordinating care for cerebral and vertebral angiogram with possible intervention.

## 2020-12-22 NOTE — Progress Notes (Addendum)
Interventional radiology stent procedure completed by Dr. Estanislado Pandy. Neurological exam completed post procedure and patient is experiencing new left sided weakness & numbness, having slight difficulty expressing words. Dr. Estanislado Pandy made aware and at bedside to assess patient. Patient is able to follow commands but left side signifcantly weaker than right. Code stroke called at 5638 and patient taken to CT for stat CT of brain. Code stroke team in [address] room at 1140. Report given to stroke nurse, Margaretha Sheffield. Blood sugar taken: 69, 25g of D50 given and repeat blood sugar 136. Patient was then taken to MRI for scan of brain after CT exam per order. After completion of MRI, patient transported by CRNA, code stroke nurse, rapid response nurse and IR nurse to [place] room 31. Report given to receiving nurse 4N, RN at bedside. During transfer from stretcher to bed on [address], patient right femoral arterial site bleeding. Manual pressure held by IR nurse for 15 minutes, Dr. Estanislado Pandy at bedside and aware. Quikclot placed after hemostasis achieved per order from Dr. Estanislado Pandy. Distal pulses palpable.

## 2020-12-22 NOTE — Progress Notes (Addendum)
ANTICOAGULATION CONSULT NOTE  Pharmacy Consult for heparin Indication:  Post Interventional Neuroradiology Procedure  Heparin Dosing Weight: 58.1 kg  Labs: Recent Labs    12/22/20 0645 12/22/20 0744  HGB 13.3  --   HCT 39.6  --   PLT 286  --   LABPROT 12.5  --   INR 0.9  --   CREATININE  --  0.68    Assessment: 49 yof presenting with R vertebral artery stenosis, s/p cerebral/vertebral angiogram with intervention. Pharmacy consulted to dose heparin infusion - not yet started due to oozing from site post-op, now resolved with pressure per RN, and she is starting heparin now per discussion with Dr. Estanislado Pandy. Patient is not on anticoagulation PTA. CBC wnl.  Goal of Therapy:  Heparin level 0.1-0.25 units/ml Monitor platelets by anticoagulation protocol: Yes   Plan:  Start heparin at 500 units/hr Check 6hr heparin level Monitor daily CBC, s/sx bleeding Heparin off at 0800 on 12/6 per protocol   Arturo Morton, PharmD, BCPS Clinical Pharmacist 12/22/2020 1:03 PM

## 2020-12-22 NOTE — Progress Notes (Signed)
Pt with rapid drop in BP. 60s/30s per Aline. Cleviprex infusion stopped promptly, pt placed in Trendelenburg. NBP reading correlates with Aline reading. Pt c/o dizziness, nausea, chest tightness. Dr. Estanislado Pandy notified. Order rec'd for 500 cc NS bolus, Neo gtt. Pt quickly responded to fluid bolus. Neo gtt not administered because BP responded well to fluids. Within minutes, SBP >140 and Cleviprex gtt restarted to maintain BP within ordered parameters. Will con't to monitor.

## 2020-12-22 NOTE — Code Documentation (Signed)
Pt is 50 yr old female OP for planned Vertebral artery stenting by Dr Estanislado Pandy. Pt was LKW at 0930 when she underwent anesthesia. When pt awoke from anesthesia, she had subjectively slurred speech and left sided weakness and decreased sensation on the left. Code stroke activated by IR team. Pt to CT stat. CT, and CTA obtained. Pt CBG 69. 1 amp D 50 given. Blood glucose rose to 136. Pt taken to stat MRI by IR staff, CRNA and SRN. Per Dr Estanislado Pandy, MRI shows small rt cerebellar infarct. Pt not candidate for TNK due to heparin administration in procedure. Pt not eligible for NIR as no LVO. Pt admitted to [facility] ICU, bedside handoff with RN Traci.

## 2020-12-22 NOTE — Consult Note (Signed)
NEUROLOGY CONSULTATION NOTE   Date of service: December 22, 2020 Patient Name: Angie Farrell MRN:  [id number] DOB:  04-07-1970 Reason for consult: "L sided weakness and numbness" Requesting Provider: Luanne Bras, MD _ _ _   _ __   _ __ _ _  __ __   _ __   __ _  History of Present Illness  Angie Farrell is a 50 y.o. 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 Upon anesthesia wearing off, she was noted to have slurring of her speech and left sided numbness and weakness. A code stroke was activated. STAT CTH w/o contrast with no acute intracranial abnormalities, CT Angio head and neck with patent R vertebral artery stent.  mRS: 0 LKW: 0930 on 12/22/20. tNKAse: not offered, low NIHSS, was given Heparin gtt during procedure. Thrombectomy: Not offered due to no LVO.  NIHSS components Score: Comment  1a Level of Conscious 0[x]  1[]  2[]  3[]      1b LOC Questions 0[x]  1[]  2[]       1c LOC Commands 0[x]  1[]  2[]       2 Best Gaze 0[x]  1[]  2[]       3 Visual 0[x]  1[]  2[]  3[]      4 Facial Palsy 0[x]  1[]  2[]  3[]      5a Motor Arm - left 0[x]  1[]  2[]  3[]  4[]  UN[]    5b Motor Arm - Right 0[x]  1[]  2[]  3[]  4[]  UN[]    6a Motor Leg - Left 0[]  1[x]  2[]  3[]  4[]  UN[]    6b Motor Leg - Right 0[x]  1[]  2[]  3[]  4[]  UN[]    7 Limb Ataxia 0[x]  1[]  2[]  3[]  UN[]     8 Sensory 0[]  1[x]  2[]  UN[]      9 Best Language 0[x]  1[]  2[]  3[]      10 Dysarthria 0[x]  1[]  2[]  UN[]      11 Extinct. and Inattention 0[x]  1[]  2[]       TOTAL: 2       ROS   Constitutional Denies weight loss, fever and chills.   HEENT Denies changes in vision and hearing.   Respiratory Denies SOB and cough.   CV Denies palpitations and CP   GI Denies abdominal pain, nausea, vomiting and diarrhea.   GU Denies dysuria and urinary frequency.   MSK Denies myalgia and joint pain.   Skin Denies  rash and pruritus.   Neurological Denies headache and syncope.   Psychiatric Denies recent changes in mood. Denies anxiety and depression.    Past History   Past Medical History:  Diagnosis Date   Allergy    Cancer ([place])    basal cell near collarbone   Ectopic cardiac rhythm    Essential hypertension 06/14/2017   Family history of premature CAD 06/14/2017   H/O diverticulitis of colon    Headache    History of kidney stones    hx of 8 stones   History of panic attacks 06/14/2017   Perennial allergic rhinitis 06/14/2017   PONV (postoperative nausea and vomiting)    Past Surgical History:  Procedure Laterality Date   ABDOMINAL AORTOGRAM N/A 08/19/2020   Procedure: ABDOMINAL AORTOGRAM;  Surgeon: Adrian Prows, MD;  Location: [facility];  Service: Cardiovascular;  Laterality: N/A;   INTRAVASCULAR PRESSURE WIRE/FFR STUDY Bilateral 10/07/2020   Procedure: INTRAVASCULAR PRESSURE WIRE/FFR STUDY;  Surgeon: Adrian Prows, MD;  Location: [facility];  Service: Cardiovascular;  Laterality: Bilateral;  RENAL ARTERIES   IR ANGIO INTRA EXTRACRAN SEL COM CAROTID INNOMINATE BILAT MOD SED  10/17/2020   IR ANGIO VERTEBRAL SEL VERTEBRAL BILAT MOD SED  10/17/2020   IR RADIOLOGIST EVAL & MGMT  10/06/2020   IR US GUIDE VASC ACCESS RIGHT  10/17/2020   LITHOTRIPSY     X 3   RENAL ANGIOGRAPHY N/A 08/19/2020   Procedure: RENAL ANGIOGRAPHY;  Surgeon: Adrian Prows, MD;  Location: [facility];  Service: Cardiovascular;  Laterality: N/A;   RENAL ANGIOGRAPHY N/A 10/07/2020   Procedure: RENAL ANGIOGRAPHY;  Surgeon: Adrian Prows, MD;  Location: [facility];  Service: Cardiovascular;  Laterality: N/A;   TONSILLECTOMY  1981   Family History  Problem Relation Age of Onset   Hypertension Mother    Heart disease Father 53   Hypertension Father    Tuberculosis Father    Heart attack Father    Hypertension Brother    Healthy Daughter    Drug abuse Son        heroine   Lung cancer Maternal  Grandmother    Heart disease Maternal Grandmother 89   Heart attack Maternal Grandmother    Hearing loss Sister    Hyperlipidemia Sister    Hypertension Sister    Social History   Socioeconomic History   Marital status: Married    Spouse name: Not on file   Number of children: 2   Years of education: Not on file   Highest education level: Not on file  Occupational History   Occupation: Electronics engineer, 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    Husband works for [employer]   2 grown children; live in [place] Strain: Not on [employer] Insecurity: Not on file  Transportation Needs: Not on file  Physical Activity: Not on file  Stress: Not on file  Social Connections: Not on file   No Known Allergies  Medications   Medications Prior to Admission  Medication Sig Dispense Refill Last Dose   acetaminophen (TYLENOL) 500 MG tablet Take 1,000 mg by mouth every 6 (six) hours as needed for moderate pain.   Past Week   aspirin EC 81 MG tablet Take 81 mg by mouth in the morning. Swallow whole.   12/21/2020   clopidogrel (PLAVIX) 75 MG tablet Take 1 tablet (75 mg total) by mouth daily. 30 tablet 6 12/22/2020 at 0515   hydrochlorothiazide (HYDRODIURIL) 12.5 MG tablet Take 4 tablets (50 mg total) by mouth every morning. (Patient taking differently: Take 6.25 mg by mouth daily as needed (sbp 180 or higher).) 30 tablet 2 Past Week   labetalol (NORMODYNE) 100 MG tablet Take 100 mg by mouth 2 (two) times daily.   12/22/2020  at 0515   rosuvastatin (CRESTOR) 20 MG tablet Take 1 tablet (20 mg total) by mouth daily. (Patient taking differently: Take 20 mg by mouth at bedtime.) 30 tablet 2  spironolactone (ALDACTONE) 50 MG tablet Take 1 tablet (50 mg total) by mouth every morning. 90 tablet 3 12/21/2020   ALPRAZolam (XANAX) 0.5 MG tablet Take 0.5 mg by mouth daily as needed for anxiety.   More than a month   cloNIDine (CATAPRES) 0.1 MG tablet Take 0.1 mg by mouth daily as needed (SBP > 180 mm Hg).   More than a month   hydrALAZINE (APRESOLINE) 50 MG tablet Take 1 tablet (50 mg total) by mouth 4 (four) times daily as needed (BP >160/100). (Patient not taking: Reported on 12/17/2020) 120 tablet 2 Not Taking     Vitals   Vitals:   12/22/20 0634 12/22/20 1249  BP: (!) 192/105   Pulse: 64 76  Resp: 17 (!) 8  Temp: 97.8 F (36.6 C)   TempSrc: Oral   SpO2: 99%   Weight: 58.1 kg   Height: 5\' 3"  (1.6 m)      Body mass index is 22.67 kg/m.  Physical Exam   General: Laying comfortably in bed; in no acute distress.  HENT: Normal oropharynx and mucosa. Normal external appearance of ears and nose.  Neck: Supple, no pain or tenderness  CV: No JVD. No peripheral edema.  Pulmonary: Symmetric Chest rise. Normal respiratory effort.  Abdomen: Soft to touch, non-tender.  Ext: No cyanosis, edema, or deformity  Skin: No rash. Normal palpation of skin.   Musculoskeletal: Normal digits and nails by inspection. No clubbing.   Neurologic Examination  Mental status/Cognition: Alert, oriented to self, place, month and year, good attention.  Speech/language: Fluent, comprehension intact, object naming intact, repetition intact.  Cranial nerves:   CN II Pupils equal and reactive to light, no VF deficits    CN III,IV,VI EOM intact, no gaze preference or deviation, no nystagmus    CN V normal sensation in V1, V2, and V3 segments bilaterally    CN VII no asymmetry, no nasolabial fold flattening    CN VIII normal hearing to  speech    CN IX & X normal palatal elevation, no uvular deviation    CN XI 5/5 head turn and 5/5 shoulder shrug bilaterally    CN XII midline tongue protrusion    Motor:  Muscle bulk: normal, tone normal, pronator drift none tremor none Mvmt Root Nerve  Muscle Right Left Comments  SA C5/6 Ax Deltoid 5 5   EF C5/6 Mc Biceps 5 5   EE C6/7/8 Rad Triceps 5 5   WF C6/7 Med FCR     WE C7/8 PIN ECU     F Ab C8/T1 U ADM/FDI 5 5   HF L1/2/3 Fem Illopsoas - 4+ R groin access for angioplasty and thus did not assess hip flexion.  KE L2/3/4 Fem Quad     DF L4/5 D Peron Tib Ant 5 5   PF S1/2 Tibial Grc/Sol 5 5    Reflexes:  Right Left Comments  Pectoralis      Biceps (C5/6) 2 2   Brachioradialis (C5/6) 2 2    Triceps (C6/7) 2 2    Patellar (L3/4) 2 2    Achilles (S1)      Hoffman      Plantar     Jaw jerk    Sensation:  Light touch Intact throughout   Pin prick    Temperature    Vibration   Proprioception    Coordination/Complex Motor:  - Finger to Nose intact BL - Heel to shin intact BL - Rapid alternating movement are normal -  Gait: Deferred due to post op from angioplasty with R femoral access. Labs   CBC:  Recent Labs  Lab 12/22/20 0645  WBC 8.4  NEUTROABS 4.8  HGB 13.3  HCT 39.6  MCV 98.0  PLT 324    Basic Metabolic Panel:  Lab Results  Component Value Date   NA 139 12/22/2020   K 3.4 (L) 12/22/2020   CO2 22 12/22/2020   GLUCOSE 89 12/22/2020   BUN 12 12/22/2020   CREATININE 0.68 12/22/2020   CALCIUM 7.9 (L) 12/22/2020   GFRNONAA >60 12/22/2020   Lipid Panel:  Lab Results  Component Value Date   LDLCALC 68 11/06/2020   HgbA1c: No results found for: HGBA1C Urine Drug Screen: No results found for: LABOPIA, COCAINSCRNUR, LABBENZ, AMPHETMU, THCU, LABBARB  Alcohol Level No results found for: [place]  CT Head without contrast(Personally reviewed): CTH was negative for a large hypodensity concerning for a large territory infarct or hyperdensity concerning  for an ICH  CT angio Head and Neck with contrast(Personally reviewed): No LVO, patent R vertebral artery stent.  MRI Brain(Personally reviewed): Small R cerebellum stroke  Impression   VIA ROSADO is a 50 y.o. 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 Upon anesthesia wearing off, she was noted to have slurring of her speech and left sided numbness and weakness.  CTH with no acute abrnomalities, CTA with patent R vert stent. Symptoms were too mild and thus not offered tNKAse, no LVO and thus not a candidate for thrombectomy. MRI Brain demonstrated a small acute R cerebellum stroke, likely periprocedural. I think this is inconsequential and does not explain her left sided symptoms.  Her left sided symptoms could potentially be a TIA.  On repeat evaluation, her symptoms have considerably improved and she has no numbness on the left, mild weakness in LLE without any drift.  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   Secondary Diagnosis: Essential (primary) hypertension  Recommendations  Plan:  - Frequent Neuro checks per stroke unit protocol - Recommend obtaining TTE - Recommend obtaining Lipid panel with LDL - Please start statin if LDL > 70 - Recommend HbA1c - Antithrombotic - Per Neuro IR after stenting of the R vertebral artery. - Recommend DVT ppx - SBP goal - goal SBP per Neuro IR team after R vert stenting. - Recommend Telemetry monitoring for arrythmia - Recommend bedside swallow screen prior to PO intake. - Stroke education booklet - Recommend PT/OT/SLP consult  ______________________________________________________________________  Plan discussed with patient, her husband and with Dr. Estanislado Pandy.  Thank you for the opportunity to take part in the care of this  patient. If you have any further questions, please contact the neurology consultation attending.  Signed,  [facility] Pager Number [phone] _ _ _   _ __   _ __ _ _  __ __   _ __   __ _

## 2020-12-22 NOTE — Sedation Documentation (Signed)
Right femoral sheath removed, 47fr angioseal closure device deployed

## 2020-12-22 NOTE — Procedures (Signed)
S/P RT vertbral abd RT common carotid arteriograms followed by revascularization of pre occlusive  RT VA stenosis with stent assisted angioplasty. MAC anesthesia. Post procedure. C/O numbess of Lt face  arm and leg. LT LE 2/5 dorsiflexion and 3/5  plantar flexion. No drift of outstretched hands. Alert ,awake Ox 3  Speech WNLs. Denies any H/As,N/V or vision difficulties. RT groin soft. Distal pulses all intact.Stat CT BRain . Alert code stroke team. S.Ia Leeb MD

## 2020-12-22 NOTE — Anesthesia Postprocedure Evaluation (Addendum)
Anesthesia Post Note  Patient: Angie Farrell  Procedure(s) Performed: STENTING     Patient location during evaluation: Other Anesthesia Type: MAC Level of consciousness: awake and alert Pain management: pain level controlled Vital Signs Assessment: post-procedure vital signs reviewed and stable Respiratory status: spontaneous breathing, nonlabored ventilation and respiratory function stable Cardiovascular status: blood pressure returned to baseline and stable Postop Assessment: no apparent nausea or vomiting Anesthetic complications: no Comments: Stroke-like symptoms postop- taken to CT and MRI, eventual resolution of symptoms- transferred to [facility], stable.   No notable events documented.  Last Vitals:  Vitals:   12/22/20 1249 12/22/20 1300  BP: (!) 153/82 120/81  Pulse: 76 73  Resp: (!) 8 11  Temp:    SpO2:  98%    Last Pain:  Vitals:   12/22/20 0701  TempSrc:   PainSc: 0-No pain                 Pervis Hocking

## 2020-12-22 NOTE — Progress Notes (Signed)
Arrived [facility] ICU room 31.

## 2020-12-22 NOTE — Transfer of Care (Signed)
Immediate Anesthesia Transfer of Care Note  Patient: Angie Farrell  Procedure(s) Performed: STENTING  Patient Location: ICU  Anesthesia Type:MAC  Level of Consciousness: awake, alert  and oriented  Airway & Oxygen Therapy: Patient Spontanous Breathing and Patient connected to nasal cannula oxygen  Post-op Assessment: Report given to RN, Post -op Vital signs reviewed and stable and Patient moving all extremities X 4  Post vital signs: Reviewed and stable  Last Vitals:  Vitals Value Taken Time  BP 112/72 12/22/20 1249  Temp    Pulse 73 12/22/20 1259  Resp 11 12/22/20 1259  SpO2 98 % 12/22/20 1259  Vitals shown include unvalidated device data.  Last Pain:  Vitals:   12/22/20 0701  TempSrc:   PainSc: 0-No pain         Complications: No notable events documented.

## 2020-12-22 NOTE — Progress Notes (Addendum)
Brief NIR note:  Patient seen in ICU with Dr. Estanislado Pandy, per patient she was talking to her spouse and suddenly began to have chest heaviness, feeling very hot and felt like she was going to pass out. Per RN patient did have episode of hypotension which resolved without medications, HR remained stable. Patient reports she feels fine now, left sided numbness and weakness has improved. She does have a mild headache on the back left side of her head. She is ready to have something to eat/drink.  A&Ox3 Motor full all 4 extremities Fine motor in tact BUE Right CFA soft Palpable pedal pulses  Continue bedrest x 2 additional hours, may advance diet, continue heparin gtt.   NIR will reassess in AM - please call Dr. Estanislado Pandy with questions or concerns overnight.  Candiss Norse, PA-C

## 2020-12-22 NOTE — Anesthesia Procedure Notes (Signed)
Arterial Line Insertion Start/End12/05/2020 8:10 AM, 12/22/2020 8:20 AM Performed by: Gaylene Brooks, CRNA  Preanesthetic checklist: patient identified, IV checked, site marked, risks and benefits discussed, surgical consent, monitors and equipment checked, pre-op evaluation, timeout performed and anesthesia consent Lidocaine 1% used for infiltration and patient sedated Left, radial was placed Catheter size: 20 G  Attempts: 1 Procedure performed without using ultrasound guided technique. Following insertion, dressing applied and Biopatch. Post procedure assessment: normal  Patient tolerated the procedure well with no immediate complications.

## 2020-12-22 NOTE — Progress Notes (Signed)
ANTICOAGULATION CONSULT NOTE  Pharmacy Consult for heparin Indication:  Post Interventional Neuroradiology Procedure  Heparin Dosing Weight: 58.1 kg  Labs: Recent Labs    12/22/20 0645 12/22/20 0744 12/22/20 2032  HGB 13.3  --   --   HCT 39.6  --   --   PLT 286  --   --   LABPROT 12.5  --   --   INR 0.9  --   --   HEPARINUNFRC  --   --  0.28*  CREATININE  --  0.68  --     Assessment: 1 yof presenting with R vertebral artery stenosis, s/p cerebral/vertebral angiogram with intervention. Pharmacy consulted to dose heparin infusion - not yet started due to oozing from site post-op, now resolved with pressure per RN, and she is starting heparin now per discussion with Dr. Estanislado Pandy. Patient is not on anticoagulation PTA. CBC wnl.   Heparin was held at 8am. Restarted at 13:15. Heparin level 0.28 is supratherapeutic on 500 units/hr.  No issues with infusion or bleeding per RN. Per consult, target heparin PTT 50-60 secs, which would be approximately 0.15 to 0.25 units/ml as a heparin level. Heparin levels are generally more consistent than aPTT. Will get one aPTT to check for correlation.     Goal of Therapy:  Heparin level 0.15-0.25 units/ml Target heparin PTT of 50 to 60 secs Monitor platelets by anticoagulation protocol: Yes   Plan:  Decrease heparin to 450 units/hr Monitor aPTT, daily HL, CBC/plt Monitor for signs/symptoms of bleeding     Benetta Spar, PharmD, BCPS, BCCP Clinical Pharmacist  Please check AMION for all [facility] phone numbers After 10:00 PM, call [place]

## 2020-12-22 NOTE — Progress Notes (Signed)
Spoke with Tressia Miners, RN regarding USGIV for vasopressors.  Per Traci, patient is not currently on vasopressors.  Traci will order IV team consult for USGIV if pressors are started.  Current order to be completed.

## 2020-12-23 ENCOUNTER — Encounter (HOSPITAL_COMMUNITY): Payer: Self-pay | Admitting: Interventional Radiology

## 2020-12-23 ENCOUNTER — Inpatient Hospital Stay (HOSPITAL_COMMUNITY): Payer: Managed Care, Other (non HMO)

## 2020-12-23 DIAGNOSIS — G459 Transient cerebral ischemic attack, unspecified: Secondary | ICD-10-CM

## 2020-12-23 LAB — CBC WITH DIFFERENTIAL/PLATELET
Abs Immature Granulocytes: 0.02 10*3/uL (ref 0.00–0.07)
Basophils Absolute: 0.1 10*3/uL (ref 0.0–0.1)
Basophils Relative: 1 %
Eosinophils Absolute: 0.3 10*3/uL (ref 0.0–0.5)
Eosinophils Relative: 3 %
HCT: 33.8 % — ABNORMAL LOW (ref 36.0–46.0)
Hemoglobin: 11.3 g/dL — ABNORMAL LOW (ref 12.0–15.0)
Immature Granulocytes: 0 %
Lymphocytes Relative: 28 %
Lymphs Abs: 2.7 10*3/uL (ref 0.7–4.0)
MCH: 32.8 pg (ref 26.0–34.0)
MCHC: 33.4 g/dL (ref 30.0–36.0)
MCV: 98.3 fL (ref 80.0–100.0)
Monocytes Absolute: 0.5 10*3/uL (ref 0.1–1.0)
Monocytes Relative: 5 %
Neutro Abs: 6 10*3/uL (ref 1.7–7.7)
Neutrophils Relative %: 63 %
Platelets: 224 10*3/uL (ref 150–400)
RBC: 3.44 MIL/uL — ABNORMAL LOW (ref 3.87–5.11)
RDW: 11.5 % (ref 11.5–15.5)
WBC: 9.5 10*3/uL (ref 4.0–10.5)
nRBC: 0 % (ref 0.0–0.2)

## 2020-12-23 LAB — BASIC METABOLIC PANEL
Anion gap: 8 (ref 5–15)
BUN: 6 mg/dL (ref 6–20)
CO2: 23 mmol/L (ref 22–32)
Calcium: 8.4 mg/dL — ABNORMAL LOW (ref 8.9–10.3)
Chloride: 107 mmol/L (ref 98–111)
Creatinine, Ser: 0.67 mg/dL (ref 0.44–1.00)
GFR, Estimated: >60 mL/min (ref >60)
Glucose, Bld: 87 mg/dL (ref 70–99)
Potassium: 3.6 mmol/L (ref 3.5–5.1)
Sodium: 138 mmol/L (ref 135–145)

## 2020-12-23 LAB — ECHOCARDIOGRAM COMPLETE BUBBLE STUDY
AR max vel: 2.34 cm2
AV Area VTI: 2.15 cm2
AV Area mean vel: 2.18 cm2
AV Mean grad: 3 mmHg
AV Peak grad: 6 mmHg
Ao pk vel: 1.22 m/s
Area-P 1/2: 2.71 cm2
S' Lateral: 2.6 cm

## 2020-12-23 LAB — LIPID PANEL
Cholesterol: 171 mg/dL (ref 0–200)
HDL: 33 mg/dL — ABNORMAL LOW (ref >40)
LDL Cholesterol: 114 mg/dL — ABNORMAL HIGH (ref 0–99)
Total CHOL/HDL Ratio: 5.2 RATIO
Triglycerides: 121 mg/dL (ref <150)
VLDL: 24 mg/dL (ref 0–40)

## 2020-12-23 LAB — HEMOGLOBIN A1C
Hgb A1c MFr Bld: 5.7 % — ABNORMAL HIGH (ref 4.8–5.6)
Mean Plasma Glucose: 116.89 mg/dL

## 2020-12-23 MED ORDER — EZETIMIBE 10 MG PO TABS: 10.0000 mg | ORAL_TABLET | Freq: Every day | ORAL | Status: DC

## 2020-12-23 MED ORDER — EZETIMIBE 10 MG PO TABS: 10.0000 mg | ORAL_TABLET | Freq: Every day | ORAL | 1 refills | Status: DC

## 2020-12-23 NOTE — Discharge Summary (Signed)
Patient ID: Angie Farrell MRN: [id number] DOB/AGE: 1970/05/05 50 y.o.  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 The procedure was performed by Dr. Estanislado Pandy without initial complication, however after extubation she was noted to have slurred speech and left sided numbness/weakness. A code stroke was activated and CT head as well as CTA head/neck showed no acute abnormalities. 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 She was transferred to the ICU for further care and her symptoms gradually resolved that afternoon. She later had a brief episode of chest heaviness/pressure, dyspnea, feeling hot and like she was going to pass out. She did not lose consciousness but was found to be hypotensive with normal heart rate. These symptoms also improved without intervention.  Patient had an uneventful night, did have some liquids and solids without difficulty. Heparin gtt and cleviprex gtt remained overnight. Her vital signs remained stable and she did not have any further neurologic symptoms or similar chest pain/pre-syncopal episodes. She feels well overall and is ready to go home today.   Reviewed 2 week follow up with Dr. Estanislado Pandy - no driving, bending/stooping/lifting anything > 10 lbs prior that appointment. We also reviewed ED return precautions including, but not limited to, new headache/slurred speech/weakness/chest pain/dyspnea/syncope. She was encouraged to make a  follow up appointment ASAP with her cardiologist to discuss the transient episode of chest pressure/pre-syncope given her history of HTN.   Consults: None  Significant Diagnostic Studies: MR BRAIN WO CONTRAST  Result Date: 12/22/2020 CLINICAL DATA:  Neuro deficit, acute, stroke suspected. EXAM: MRI HEAD WITHOUT CONTRAST TECHNIQUE: Multiplanar, multiecho pulse sequences of the brain and surrounding structures were obtained without intravenous contrast. COMPARISON:  Noncontrast head CT and CT angiogram head/neck 12/22/2020. FINDINGS: Brain: Cerebral volume is normal. 8 mm acute infarct within the mid right cerebellar hemisphere, laterally (series 5, image 63). There are a few small scattered foci of T2 FLAIR hyperintense signal abnormality within the bilateral frontal lobe white matter, nonspecific but most often secondary to chronic small vessel ischemia. No evidence of an intracranial mass. No chronic intracranial blood products. No extra-axial fluid collection. No midline shift. Vascular: Maintained flow voids within the proximal large arterial vessels. Skull and upper cervical spine: No focal suspicious marrow lesion. Sinuses/Orbits: Visualized orbits show no acute finding. Mild mucosal thickening within the bilateral ethmoid sinuses. 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 There are a few small scattered foci of T2 FLAIR hyperintense signal abnormality within the bilateral frontal lobe white matter, nonspecific but most often secondary to chronic small vessel ischemia. Mild bilateral ethmoid sinus mucosal thickening. Electronically Signed   By: Kellie Simmering D.O.   On: 12/22/2020 12:52   CT HEAD CODE STROKE WO CONTRAST`  Result Date: 12/22/2020 CLINICAL DATA:  Neuro deficit, acute, stroke suspected; altered mental status post neuro IR procedure EXAM: CT HEAD WITHOUT CONTRAST CT ANGIOGRAPHY OF THE HEAD AND NECK TECHNIQUE: Contiguous axial images were obtained from the base  of the skull through the vertex without intravenous contrast. Multidetector CT imaging of the head and neck  was performed using the standard protocol during bolus administration of intravenous contrast. Multiplanar CT image reconstructions and MIPs were obtained to evaluate the vascular anatomy. Carotid stenosis measurements (when applicable) are obtained utilizing NASCET criteria, using the distal internal carotid diameter as the denominator. CONTRAST:  92mL OMNIPAQUE IOHEXOL 350 MG/ML SOLN COMPARISON:  None. FINDINGS: CT HEAD Brain: There is no acute intracranial hemorrhage, mass effect, or edema. Gray-white differentiation is preserved. There is no extra-axial fluid collection. Ventricles and sulci are within normal limits in size and configuration. Vascular: Residual contrast from procedure. Skull: Calvarium is unremarkable. Sinuses/Orbits: No acute finding. Other: None. Review of the MIP images confirms the above findings CTA NECK Aortic arch: Great vessel origins are patent. Right carotid system: Patent. Mild noncalcified plaque along the common carotid. Punctate calcified plaque at the ICA origin. No stenosis. Left carotid system: Patent. Mild noncalcified plaque along the common carotid. Eccentric noncalcified plaque along the internal carotid origin without stenosis. Vertebral arteries: Interval placement of a stent at the right vertebral origin. Stent appears to be patent. As before, there is a linear filling defect at the C5 level. Probably reflects fenestration and there was no evidence of intimal flap on catheter angiogram. Left vertebral artery is patent. No new stenosis. Skeleton: No new abnormality. Other neck: No new abnormality. Upper chest: Included upper lungs are clear. Review of the MIP images confirms the above findings CTA HEAD Anterior circulation: Intracranial internal carotid arteries are patent with mild calcified plaque. Anterior and middle cerebral arteries are patent. Posterior  circulation: Intracranial vertebral arteries are patent. Basilar artery is patent. Major cerebellar artery origins are patent. Posterior cerebral arteries are patent. Venous sinuses: Patent as allowed by contrast bolus timing. Review of the MIP images confirms the above findings IMPRESSION: No acute intracranial hemorrhage or evidence of acute infarction. No new vessel occlusion. Interval stenting of the right vertebral origin. Stent appears patent. Unchanged linear filling defect of the right vertebral at the C5 level that probably reflects fenestration. Electronically Signed   By: Macy Mis M.D.   On: 12/22/2020 12:15   CT ANGIO HEAD NECK W WO CM (CODE STROKE)  Result Date: 12/22/2020 CLINICAL DATA:  Neuro deficit, acute, stroke suspected; altered mental status post neuro IR procedure EXAM: CT HEAD WITHOUT CONTRAST CT ANGIOGRAPHY OF THE HEAD AND NECK TECHNIQUE: Contiguous axial images were obtained from the base of the skull through the vertex without intravenous contrast. Multidetector CT imaging of the head and neck was performed using the standard protocol during bolus administration of intravenous contrast. Multiplanar CT image reconstructions and MIPs were obtained to evaluate the vascular anatomy. Carotid stenosis measurements (when applicable) are obtained utilizing NASCET criteria, using the distal internal carotid diameter as the denominator. CONTRAST:  40mL OMNIPAQUE IOHEXOL 350 MG/ML SOLN COMPARISON:  None. FINDINGS: CT HEAD Brain: There is no acute intracranial hemorrhage, mass effect, or edema. Gray-white differentiation is preserved. There is no extra-axial fluid collection. Ventricles and sulci are within normal limits in size and configuration. Vascular: Residual contrast from procedure. Skull: Calvarium is unremarkable. Sinuses/Orbits: No acute finding. Other: None. Review of the MIP images confirms the above findings CTA NECK Aortic arch: Great vessel origins are patent. Right carotid  system: Patent. Mild noncalcified plaque along the common carotid. Punctate calcified plaque at the ICA origin. No stenosis. Left carotid system: Patent. Mild noncalcified plaque along the common carotid. Eccentric noncalcified plaque along the internal carotid origin without stenosis. Vertebral arteries: Interval placement of a stent at the right vertebral  origin. Stent appears to be patent. As before, there is a linear filling defect at the C5 level. Probably reflects fenestration and there was no evidence of intimal flap on catheter angiogram. Left vertebral artery is patent. No new stenosis. Skeleton: No new abnormality. Other neck: No new abnormality. Upper chest: Included upper lungs are clear. Review of the MIP images confirms the above findings CTA HEAD Anterior circulation: Intracranial internal carotid arteries are patent with mild calcified plaque. Anterior and middle cerebral arteries are patent. Posterior circulation: Intracranial vertebral arteries are patent. Basilar artery is patent. Major cerebellar artery origins are patent. Posterior cerebral arteries are patent. Venous sinuses: Patent as allowed by contrast bolus timing. Review of the MIP images confirms the above findings IMPRESSION: No acute intracranial hemorrhage or evidence of acute infarction. No new vessel occlusion. Interval stenting of the right vertebral origin. Stent appears patent. Unchanged linear filling defect of the right vertebral at the C5 level that probably reflects fenestration. Electronically Signed   By: Macy Mis M.D.   On: 12/22/2020 12:15    Treatments: IV hydration and anticoagulation: Plavix and heparin  Discharge Exam: Blood pressure 138/86, pulse 63, temperature 98.5 F (36.9 C), temperature source Oral, resp. rate 14, height 5\' 3"  (1.6 m), weight 128 lb (58.1 kg), SpO2 97 %. Physical Exam Vitals and nursing note reviewed.  Constitutional:      General: She is not in acute distress. HENT:     Head:  Normocephalic.  Cardiovascular:     Rate and Rhythm: Normal rate and regular rhythm.     Comments: (+) right CFA puncture site clean, dry, dressed appropriately. Soft, minimally tender, no active bleeding or drainage. Pulmonary:     Effort: Pulmonary effort is normal.     Breath sounds: Normal breath sounds.  Abdominal:     Palpations: Abdomen is soft.  Skin:    General: Skin is warm and dry.  Neurological:     Mental Status: She is alert.  Alert, awake, and oriented x 4 Speech and comprehension in tact PERRL bilaterally EOMs without nystagmus or subjective diplopia. Visual fields in tact No facial asymmetry. Tongue midline Motor power - 5/5 RUE/RLE/LUE, 4(?)/5 LLE -- per patient feels slightly weaker, difficult to tell on exam if truly weaker Fine motor and coordination in tact Distal pulses palpable bilaterally   Disposition:    Allergies as of 12/23/2020   No Known Allergies      Medication List     STOP taking these medications    hydrALAZINE 50 MG tablet Commonly known as: APRESOLINE       TAKE these medications    acetaminophen 500 MG tablet Commonly known as: TYLENOL Take 1,000 mg by mouth every 6 (six) hours as needed for moderate pain.   ALPRAZolam 0.5 MG tablet Commonly known as: XANAX Take 0.5 mg by mouth daily as needed for anxiety.   aspirin EC 81 MG tablet Take 81 mg by mouth in the morning. Swallow whole.   cloNIDine 0.1 MG tablet Commonly known as: CATAPRES Take 0.1 mg by mouth daily as needed (SBP > 180 mm Hg).   clopidogrel 75 MG tablet Commonly known as: Plavix Take 1 tablet (75 mg total) by mouth daily.   hydrochlorothiazide 12.5 MG tablet Commonly known as: HYDRODIURIL Take 4 tablets (50 mg total) by mouth every morning. What changed:  how much to take when to take this reasons to take this   labetalol 100 MG tablet Commonly known as: NORMODYNE Take 100  mg by mouth 2 (two) times daily.   rosuvastatin 20 MG  tablet Commonly known as: CRESTOR Take 1 tablet (20 mg total) by mouth daily. What changed: when to take this   spironolactone 50 MG tablet Commonly known as: ALDACTONE Take 1 tablet (50 mg total) by mouth every morning.          Electronically Signed: Joaquim Nam, PA-C 12/23/2020, 9:25 AM   I have spent Greater Than 30 Minutes discharging [place].

## 2020-12-23 NOTE — Evaluation (Signed)
Occupational Therapy Evaluation Patient Details Name: Angie Farrell MRN: [id number] DOB: March 07, 1970 Today's Date: 12/23/2020   History of Present Illness Pt is a 50 y/o female s/p arteriogram with revascularization secondary to R vertebral artery dissection on 12/5. Following procedure, pt developed slurred speech and L sided weakness, however, symptoms quickly improved. MRI showed infarct in R cerebellum. PMH includes HTN and panic attacks.   Clinical Impression   PTA, pt was living with her husband and was independent. Currently, pt performing at Mod I level for ADLs and functional mobility. Provided education on compensatory techniques for LB ADLs and tub transfer. Pt with slight decrease in strength and coordination at LUE; also reporting slight numbness. Feel this will improve with time. Recommending pt contact MD if does not improve for OP referral. Answered all pt questions. Recommend dc home once medically stable per physician. All acute OT needs met and will sign off. Thank you.      Recommendations for follow up therapy are one component of a multi-disciplinary discharge planning process, led by the attending physician.  Recommendations may be updated based on patient status, additional functional criteria and insurance authorization.   Follow Up Recommendations  No OT follow up    Assistance Recommended at Discharge None  Functional Status Assessment  Patient has had a recent decline in their functional status and demonstrates the ability to make significant improvements in function in a reasonable and predictable amount of time.  Equipment Recommendations  None recommended by OT (Discussed possible need for shower chair if pt fatigues at home)    Recommendations for Other Services       Precautions / Restrictions Precautions Precautions: Fall Restrictions Weight Bearing Restrictions: No      Mobility Bed Mobility Overal bed mobility: Independent                   Transfers Overall transfer level: Modified independent                 General transfer comment: increased time      Balance Overall balance assessment: No apparent balance deficits (not formally assessed)                                         ADL either performed or assessed with clinical judgement   ADL Overall ADL's : Modified independent                                       General ADL Comments: Increased time. Education provided on compensatory techniques including threading LLE into pants     Vision Baseline Vision/History: 1 Wears glasses Patient Visual Report: No change from baseline       Perception     Praxis      Pertinent Vitals/Pain Pain Assessment: No/denies pain     Hand Dominance Right   Extremity/Trunk Assessment Upper Extremity Assessment Upper Extremity Assessment: LUE deficits/detail LUE Deficits / Details: Slight decreased in strength and coordination. Feel this will improve with time. LUE Sensation: decreased light touch (finger tips) LUE Coordination: decreased fine motor   Lower Extremity Assessment Lower Extremity Assessment: Defer to PT evaluation   Cervical / Trunk Assessment Cervical / Trunk Assessment: Normal   Communication Communication Communication: No difficulties   Cognition Arousal/Alertness: Awake/alert Behavior During Therapy: [facility]  for tasks assessed/performed Overall Cognitive Status: Within Functional Limits for tasks assessed Able to name three animals that start with "s" - requiring increased time. During simple money management question, pt requiring to write math out to obtain correct answer.                                        General Comments  VSS on RA. Providing education on screen time and proper rest    Exercises     Shoulder Instructions      Home Living Family/patient expects to be discharged to:: Private residence Living Arrangements:  Spouse/significant other Available Help at Discharge: Family;Available 24 hours/day (husband staying home from work) Type of Home: House Home Access: Stairs to enter Technical brewer of Steps: 1   Home Layout: Two level;Bed/bath upstairs Alternate Level Stairs-Number of Steps: Flight Alternate Level Stairs-Rails: Right     Bathroom Toilet: Standard     Home Equipment: None          Prior Functioning/Environment Prior Level of Function : Independent/Modified Independent;Working/employed;Driving               ADLs Comments: works as Automotive engineer Problem List: Decreased activity tolerance;Decreased range of motion;Decreased knowledge of precautions;Decreased knowledge of use of DME or AE      OT Treatment/Interventions:      OT Goals(Current goals can be found in the care plan section) Acute Rehab OT Goals Patient Stated Goal: Go home OT Goal Formulation: All assessment and education complete, DC therapy  OT Frequency:     Barriers to D/C:            Co-evaluation              AM-PAC OT "6 Clicks" Daily Activity     Outcome Measure Help from another person eating meals?: None Help from another person taking care of personal grooming?: None Help from another person toileting, which includes using toliet, bedpan, or urinal?: None Help from another person bathing (including washing, rinsing, drying)?: None Help from another person to put on and taking off regular upper body clothing?: None Help from another person to put on and taking off regular lower body clothing?: None 6 Click Score: 24   End of Session Nurse Communication: Mobility status  Activity Tolerance: Patient tolerated treatment well Patient left: in chair;with call bell/phone within reach  OT Visit Diagnosis: Unsteadiness on feet (R26.81);Other abnormalities of gait and mobility (R26.89);Muscle weakness (generalized) (M62.81)                Time: 9485-4627 OT Time  Calculation (min): 22 min Charges:  OT General Charges $OT Visit: 1 Visit OT Evaluation $OT Eval Moderate Complexity: [place], OTR/L Acute Rehab Pager: [phone] Office: [place] 12/23/2020, 11:14 AM

## 2020-12-23 NOTE — Progress Notes (Signed)
  Echocardiogram 2D Echocardiogram has been performed.  Merrie Roof F 12/23/2020, 10:35 AM

## 2020-12-23 NOTE — Progress Notes (Addendum)
STROKE TEAM PROGRESS NOTE   INTERVAL HISTORY Patient is seen in her room with no family at bedside.  Yesterday, she  underwent stenting of right vertebral artery and experienced left-sided weakness and numbness after the procedure.  She was found to have a small right sided cerebellar stroke on MRI.  Which is likely incidental periprocedural stroke.  Overnight, symptoms have resolved and were more likely caused by a TIA.  Neurological exam is normal with the exception of right sided saccades.  Blood pressure is stable and adequately controlled  Vitals:   12/23/20 0600 12/23/20 0700 12/23/20 0800 12/23/20 0900  BP: 104/63 102/67 113/71 138/86  Pulse: (!) 59 61 71 63  Resp: 13 12 13 14   Temp:   98.5 F (36.9 C)   TempSrc:   Oral   SpO2: 96% 96% 96% 97%  Weight:      Height:       CBC:  Recent Labs  Lab 12/22/20 0645 12/23/20 0521  WBC 8.4 9.5  NEUTROABS 4.8 6.0  HGB 13.3 11.3*  HCT 39.6 33.8*  MCV 98.0 98.3  PLT 286 174   Basic Metabolic Panel:  Recent Labs  Lab 12/22/20 0744 12/23/20 0521  NA 139 138  K 3.4* 3.6  CL 111 107  CO2 22 23  GLUCOSE 89 87  BUN 12 6  CREATININE 0.68 0.67  CALCIUM 7.9* 8.4*   Lipid Panel: No results for input(s): CHOL, TRIG, HDL, CHOLHDL, VLDL, LDLCALC in the last 168 hours. HgbA1c: No results for input(s): HGBA1C in the last 168 hours. Urine Drug Screen: No results for input(s): LABOPIA, COCAINSCRNUR, LABBENZ, AMPHETMU, THCU, LABBARB in the last 168 hours.  Alcohol Level No results for input(s): ETH in the last 168 hours.  IMAGING past 24 hours MR BRAIN WO CONTRAST  Result Date: 12/22/2020 CLINICAL DATA:  Neuro deficit, acute, stroke suspected. EXAM: MRI HEAD WITHOUT CONTRAST TECHNIQUE: Multiplanar, multiecho pulse sequences of the brain and surrounding structures were obtained without intravenous contrast. COMPARISON:  Noncontrast head CT and CT angiogram head/neck 12/22/2020. FINDINGS: Brain: Cerebral volume is normal. 8 mm acute infarct  within the mid right cerebellar hemisphere, laterally (series 5, image 63). There are a few small scattered foci of T2 FLAIR hyperintense signal abnormality within the bilateral frontal lobe white matter, nonspecific but most often secondary to chronic small vessel ischemia. No evidence of an intracranial mass. No chronic intracranial blood products. No extra-axial fluid collection. No midline shift. Vascular: Maintained flow voids within the proximal large arterial vessels. Skull and upper cervical spine: No focal suspicious marrow lesion. Sinuses/Orbits: Visualized orbits show no acute finding. Mild mucosal thickening within the bilateral ethmoid sinuses. 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 There are a few small scattered foci of T2 FLAIR hyperintense signal abnormality within the bilateral frontal lobe white matter, nonspecific but most often secondary to chronic small vessel ischemia. Mild bilateral ethmoid sinus mucosal thickening. Electronically Signed   By: Kellie Simmering D.O.   On: 12/22/2020 12:52   CT HEAD CODE STROKE WO CONTRAST`  Result Date: 12/22/2020 CLINICAL DATA:  Neuro deficit, acute, stroke suspected; altered mental status post neuro IR procedure EXAM: CT HEAD WITHOUT CONTRAST CT ANGIOGRAPHY OF THE HEAD AND NECK TECHNIQUE: Contiguous axial images were obtained from the base of the skull through the vertex without intravenous contrast. Multidetector CT imaging of the head and neck was performed using the standard protocol during bolus administration of intravenous contrast. Multiplanar CT image reconstructions and MIPs were  obtained to evaluate the vascular anatomy. Carotid stenosis measurements (when applicable) are obtained utilizing NASCET criteria, using the distal internal carotid diameter as the denominator. CONTRAST:  6mL OMNIPAQUE IOHEXOL 350 MG/ML SOLN COMPARISON:  None. FINDINGS: CT HEAD Brain: There is no acute intracranial hemorrhage, mass effect,  or edema. Gray-white differentiation is preserved. There is no extra-axial fluid collection. Ventricles and sulci are within normal limits in size and configuration. Vascular: Residual contrast from procedure. Skull: Calvarium is unremarkable. Sinuses/Orbits: No acute finding. Other: None. Review of the MIP images confirms the above findings CTA NECK Aortic arch: Great vessel origins are patent. Right carotid system: Patent. Mild noncalcified plaque along the common carotid. Punctate calcified plaque at the ICA origin. No stenosis. Left carotid system: Patent. Mild noncalcified plaque along the common carotid. Eccentric noncalcified plaque along the internal carotid origin without stenosis. Vertebral arteries: Interval placement of a stent at the right vertebral origin. Stent appears to be patent. As before, there is a linear filling defect at the C5 level. Probably reflects fenestration and there was no evidence of intimal flap on catheter angiogram. Left vertebral artery is patent. No new stenosis. Skeleton: No new abnormality. Other neck: No new abnormality. Upper chest: Included upper lungs are clear. Review of the MIP images confirms the above findings CTA HEAD Anterior circulation: Intracranial internal carotid arteries are patent with mild calcified plaque. Anterior and middle cerebral arteries are patent. Posterior circulation: Intracranial vertebral arteries are patent. Basilar artery is patent. Major cerebellar artery origins are patent. Posterior cerebral arteries are patent. Venous sinuses: Patent as allowed by contrast bolus timing. Review of the MIP images confirms the above findings IMPRESSION: No acute intracranial hemorrhage or evidence of acute infarction. No new vessel occlusion. Interval stenting of the right vertebral origin. Stent appears patent. Unchanged linear filling defect of the right vertebral at the C5 level that probably reflects fenestration. Electronically Signed   By: Macy Mis  M.D.   On: 12/22/2020 12:15   CT ANGIO HEAD NECK W WO CM (CODE STROKE)  Result Date: 12/22/2020 CLINICAL DATA:  Neuro deficit, acute, stroke suspected; altered mental status post neuro IR procedure EXAM: CT HEAD WITHOUT CONTRAST CT ANGIOGRAPHY OF THE HEAD AND NECK TECHNIQUE: Contiguous axial images were obtained from the base of the skull through the vertex without intravenous contrast. Multidetector CT imaging of the head and neck was performed using the standard protocol during bolus administration of intravenous contrast. Multiplanar CT image reconstructions and MIPs were obtained to evaluate the vascular anatomy. Carotid stenosis measurements (when applicable) are obtained utilizing NASCET criteria, using the distal internal carotid diameter as the denominator. CONTRAST:  70mL OMNIPAQUE IOHEXOL 350 MG/ML SOLN COMPARISON:  None. FINDINGS: CT HEAD Brain: There is no acute intracranial hemorrhage, mass effect, or edema. Gray-white differentiation is preserved. There is no extra-axial fluid collection. Ventricles and sulci are within normal limits in size and configuration. Vascular: Residual contrast from procedure. Skull: Calvarium is unremarkable. Sinuses/Orbits: No acute finding. Other: None. Review of the MIP images confirms the above findings CTA NECK Aortic arch: Great vessel origins are patent. Right carotid system: Patent. Mild noncalcified plaque along the common carotid. Punctate calcified plaque at the ICA origin. No stenosis. Left carotid system: Patent. Mild noncalcified plaque along the common carotid. Eccentric noncalcified plaque along the internal carotid origin without stenosis. Vertebral arteries: Interval placement of a stent at the right vertebral origin. Stent appears to be patent. As before, there is a linear filling defect at the C5 level. Probably  reflects fenestration and there was no evidence of intimal flap on catheter angiogram. Left vertebral artery is patent. No new stenosis.  Skeleton: No new abnormality. Other neck: No new abnormality. Upper chest: Included upper lungs are clear. Review of the MIP images confirms the above findings CTA HEAD Anterior circulation: Intracranial internal carotid arteries are patent with mild calcified plaque. Anterior and middle cerebral arteries are patent. Posterior circulation: Intracranial vertebral arteries are patent. Basilar artery is patent. Major cerebellar artery origins are patent. Posterior cerebral arteries are patent. Venous sinuses: Patent as allowed by contrast bolus timing. Review of the MIP images confirms the above findings IMPRESSION: No acute intracranial hemorrhage or evidence of acute infarction. No new vessel occlusion. Interval stenting of the right vertebral origin. Stent appears patent. Unchanged linear filling defect of the right vertebral at the C5 level that probably reflects fenestration. Electronically Signed   By: Macy Mis M.D.   On: 12/22/2020 12:15    PHYSICAL EXAM General: Alert, well-developed patient in no acute distress.   NEURO:  Mental Status: AA&Ox3  Speech/Language: speech is without dysarthria or aphasia.    Cranial Nerves:  II: PERRL.  III, IV, VI: EOMI. Eyelids elevate symmetrically.  Dysmetric saccades present when looking to the right V: Sensation is intact to light touch   VII: Smile is symmetrical. Able to raise eyebrows.  VIII: hearing intact to voice. IX, X:  Phonation is normal.  XII: tongue is midline without fasciculations. Motor: 5/5 strength to all muscle groups tested.  Sensation- Intact to light touch bilaterally.   Coordination: FTN intact bilaterally, HKS: no ataxia in BLE.  Gait- deferred    ASSESSMENT/PLAN Ms. ARIEONNA MEDINE is a 50 y.o. female with history of fibromuscular dysplasia, diverticulitis, panic attacks, nephrolitiasis, HTN, headaches and neck pain presenting with left sided weakness and numbness after undergoing stenting of the right vertebral artery.  She was found to have a small right cerebellar stroke, but her presenting symptoms are more consistent with a TIA.  Symptoms have resolved overnight.   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  Transient left body weakness and numbness likely due to right brain TIA periprocedural. MRI  35mm acute right cerebellar infarct 2D Echo pending LDL 68 HgbA1c No results found for requested labs within last 26280 hours. VTE prophylaxis - SCDs    Diet   Diet regular Room service appropriate? Yes with Assist; Fluid consistency: Thin   aspirin 81 mg daily and clopidogrel 75 mg daily prior to admission, now on aspirin 81 mg daily and clopidogrel 75 mg daily.  Therapy recommendations:  pending Disposition:  to home  Hypertension Home meds:  clonidine 0.1 mg daily PRN, hydralazine 50 mg QID PRN, HCTZ 12.5 mg daily, labetalol 100 mg BID, aldactone 50 mg daily Stable Kepp SBP <180 Long-term BP goal normotensive  Hyperlipidemia Home meds:  rosuvastatin 20 mg daily, resumed in hospital LDL 114, goal < 70 Add zetia  High intensity statin continued Continue statin at discharge  Risk for Diabetes type II  Home meds:  none HgbA1c No results found for requested labs within last 26280 hours., goal < 7.0 CBGs Recent Labs    12/22/20 1150 12/22/20 1159  GLUCAP 69* 136*    SSI  S/p stenting of right vertebral artery Continue antiplatelet therapy per IR  Other Stroke Risk Factors   Other Active Problems none  Hospital day # 1  Cortney E Tennis Must  Yolanda Manges , MSN, AGACNP-BC [facility] See Amion for schedule and pager information 12/23/2020 12:12 PM   STROKE MD NOTE : I have personally obtained history,examined this patient, reviewed notes, independently viewed imaging studies, participated in medical decision making and plan of care.ROS completed by me personally and pertinent positives fully  documented  I have made any additions or clarifications directly to the above note. Agree with note above.  Patient developed transient left body numbness and weakness following elective right vertebral artery origin angioplasty stenting procedure.  MRI scan shows silent small right cerebellar infarct which is periprocedural clinically silent.  He likely had a small periprocedural right subcortical TIA.  Recommend aggressive risk factor modification-CTA to Crestor continue dual antiplatelet therapy rebuild strength.  Follow-up as an outpatient stroke clinic in 2 months.  Greater than 50% time during this 35-minute visit was spent in counseling and coordination of care and discussion about stroke prevention treatment and answering questions.  Antony Contras, MD Medical Director [facility] Pager: [phone] 12/23/2020 1:58 PM   To contact Stroke Continuity provider, please refer to [email or web address] After hours, contact General Neurology

## 2020-12-23 NOTE — Evaluation (Signed)
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 Following procedure, pt developed slurred speech and L sided weakness, however, symptoms quickly improved. MRI showed infarct in R cerebellum. PMH includes HTN and panic attacks.  Clinical Impression  Pt admitted secondary to problem above with deficits below. Requiring min guard A for safety during gait and stair navigation. No overt LOB, but pt very guarded. Reports mild weakness in LLE. Anticipate pt will progress well and will not require follow up PT. Educated about activity pacing at home. Will continue to follow acutely.        Recommendations for follow up therapy are one component of a multi-disciplinary discharge planning process, led by the attending physician.  Recommendations may be updated based on patient status, additional functional criteria and insurance authorization.  Follow Up Recommendations No PT follow up    Assistance Recommended at Discharge PRN  Functional Status Assessment Patient has had a recent decline in their functional status and demonstrates the ability to make significant improvements in function in a reasonable and predictable amount of time.  Equipment Recommendations  None recommended by PT    Recommendations for Other Services       Precautions / Restrictions Precautions Precautions: Fall Restrictions Weight Bearing Restrictions: No      Mobility  Bed Mobility Overal bed mobility: Independent             General bed mobility comments: In chair upon entry    Transfers Overall transfer level: Needs assistance Equipment used: None Transfers: Sit to/from Stand Sit to Stand: Supervision           General transfer comment: Supervision for safety     Ambulation/Gait Ambulation/Gait assistance: Supervision;Min guard Gait Distance (Feet): 250 Feet Assistive device: None Gait Pattern/deviations: Step-through pattern;Decreased stride length Gait velocity: Decreased     General Gait Details: Slow, cautious gait. Min guard for safety. Fatigued after longer distance. Educated about activity pacing.  Stairs Stairs: Yes Stairs assistance: Min guard Stair Management: One rail Right;Alternating pattern;Step to pattern;Forwards Number of Stairs: 10 General stair comments: Alternating pattern going up steps and step to pattern for safety going down steps. Min guard for safety.  Wheelchair Mobility    Modified Rankin (Stroke Patients Only) Modified Rankin (Stroke Patients Only) Pre-Morbid Rankin Score: No symptoms Modified Rankin: Moderately severe disability     Balance Overall balance assessment: Mild deficits observed, not formally tested                                           Pertinent Vitals/Pain Pain Assessment: No/denies pain    Home Living Family/patient expects to be discharged to:: Private residence Living Arrangements: Spouse/significant other Available Help at Discharge: Family;Available 24 hours/day (husband staying home from work) Type of Home: House Home Access: Stairs to enter Entrance Stairs-Rails: None Entrance Stairs-Number of Steps: 1 Alternate Level Stairs-Number of Steps: Flight Home Layout: Two level Home Equipment: None      Prior Function Prior Level of Function : Independent/Modified Independent;Working/employed;Driving               ADLs Comments: works as Engineer, mining   Dominant Hand: Right    Extremity/Trunk  Assessment   Upper Extremity Assessment Upper Extremity Assessment: Defer to OT evaluation LUE Deficits / Details: Slight decreased in strength and coordination. Feel this will improve with time. LUE Sensation:  (finger tips) LUE  Coordination: decreased fine motor    Lower Extremity Assessment Lower Extremity Assessment: LLE deficits/detail LLE Deficits / Details: Mild functional weakness noted and pt reporting decreased sensation in L thigh.    Cervical / Trunk Assessment Cervical / Trunk Assessment: Normal  Communication   Communication: No difficulties  Cognition Arousal/Alertness: Awake/alert Behavior During Therapy: WFL for tasks assessed/performed Overall Cognitive Status: Within Functional Limits for tasks assessed                                          General Comments General comments (skin integrity, edema, etc.): VSS    Exercises     Assessment/Plan    PT Assessment Patient needs continued PT services  PT Problem List Decreased balance;Decreased mobility;Decreased knowledge of use of DME;Decreased knowledge of precautions       PT Treatment Interventions Gait training;DME instruction;Therapeutic activities;Functional mobility training;Stair training;Therapeutic exercise;Balance training;Patient/family education    PT Goals (Current goals can be found in the Care Plan section)  Acute Rehab PT Goals Patient Stated Goal: to go home PT Goal Formulation: With patient Time For Goal Achievement: 01/06/21 Potential to Achieve Goals: Good    Frequency Min 3X/week   Barriers to discharge        Co-evaluation               AM-PAC PT "6 Clicks" Mobility  Outcome Measure Help needed turning from your back to your side while in a flat bed without using bedrails?: None Help needed moving from lying on your back to sitting on the side of a flat bed without using bedrails?: None Help needed moving to and from a bed to a chair (including a wheelchair)?: A Little Help needed standing up from a chair using your arms (e.g., wheelchair or bedside chair)?: A Little Help needed to walk in hospital room?: A Little Help needed climbing 3-5 steps with a railing? : A Little 6  Click Score: 20    End of Session Equipment Utilized During Treatment: Gait belt Activity Tolerance: Patient tolerated treatment well Patient left: in bed;with call bell/phone within reach Nurse Communication: Mobility status PT Visit Diagnosis: Other abnormalities of gait and mobility (R26.89);Other symptoms and signs involving the nervous system (J88.416)    Time: 6063-0160 PT Time Calculation (min) (ACUTE ONLY): 11 min   Charges:   PT Evaluation $PT Eval Low Complexity: 1 Low          Lou Miner, DPT  Acute Rehabilitation Services  Pager: [phone] Office: [phone]   Rudean Hitt 12/23/2020, 12:02 PM

## 2020-12-23 NOTE — Discharge Instructions (Signed)
Femoral Site Care This sheet gives you information about how to care for yourself after your procedure. Your health care provider may also give you more specific instructions. If you have problems or questions, contact your health care provider. What can I expect after the procedure? After the procedure, it is common to have: Bruising that usually fades within 1-2 weeks. Tenderness at the site. Follow these instructions at home: Wound care Follow instructions from your health care provider about how to take care of your insertion site. Make sure you: Wash your hands with soap and water before you change your bandage (dressing). If soap and water are not available, use hand sanitizer. Change your dressing as directed- pressure dressing removed 24 hours post-procedure (and switch for bandaid), bandaid removed 72 hours post-procedure Do not take baths, swim, or use a hot tub for 7 days post-procedure. You may shower 48 hours after the procedure or as told by your health care provider. Gently wash the site with plain soap and water. Pat the area dry with a clean towel. Do not rub the site. This may cause bleeding. Check your site every day for signs of infection. Check for: Redness, swelling, or pain. Fluid or blood. Warmth. Pus or a bad smell. Activity Do not stoop, bend, or lift anything that is heavier than 10 lb (4.5 kg) for 2 weeks post-procedure. Do not drive self for 2 weeks post-procedure. Contact a health care provider if you have: A fever or chills. You have redness, swelling, or pain around your insertion site. Get help right away if: The catheter insertion area swells very fast. You pass out. You suddenly start to sweat or your skin gets clammy. The catheter insertion area is bleeding, and the bleeding does not stop when you hold steady pressure on the area. The area near or just beyond the catheter insertion site becomes pale, cool, tingly, or numb. These symptoms may  represent a serious problem that is an emergency. Do not wait to see if the symptoms will go away. Get medical help right away. Call your local emergency services (911 in the [place] Do not drive yourself to the hospital.  This information is not intended to replace advice given to you by your health care provider. Make sure you discuss any questions you have with your health care provider. Document Revised: 01/17/2017 Document Reviewed: 01/17/2017 Elsevier Patient Education  2020 [employer]  

## 2020-12-26 ENCOUNTER — Other Ambulatory Visit: Payer: Self-pay | Admitting: *Deleted

## 2020-12-26 NOTE — Patient Outreach (Signed)
[facility]) Care Management  12/26/2020  Angie Farrell 04-29-1970 [id number]   RED ON EMMI ALERT - Stroke Day # 1 Date: 12/8 Red Alert Reason: Not scheduled follow up appointment   Outreach attempt #1, successful.  Identity verified.  This care manager introduced self and stated purpose of call.  [facility] care management services explained.    Report she is doing well since discharge, "just tired."  State she is independent but has her husband at home to help with needed.  She was told that IR office would call to schedule follow up by yesterday, however they didn't so she will call today.  She will also call cardiology today to schedule follow up, husband will provide transportation to both.  Denies any questions regarding medications, monitor her blood pressure twice a day, automatically transmitted to cardiology office.  Denies any urgent concerns, encouraged to contact this care manager with questions.    Plan: RN CM will send successful outreach letter with this care manager's contact information and stroke recovery education.  Will also follow up within the next 2 weeks.  Valente David, [place], MSN [place] [phone]

## 2020-12-29 ENCOUNTER — Other Ambulatory Visit: Payer: Self-pay

## 2020-12-29 ENCOUNTER — Ambulatory Visit: Payer: Managed Care, Other (non HMO) | Admitting: Cardiology

## 2020-12-29 ENCOUNTER — Encounter: Payer: Self-pay | Admitting: Cardiology

## 2020-12-29 VITALS — BP 149/93 | HR 72 | Temp 97.7°F | Resp 17 | Ht 63.0 in | Wt 126.6 lb

## 2020-12-29 DIAGNOSIS — E782 Mixed hyperlipidemia: Secondary | ICD-10-CM

## 2020-12-29 DIAGNOSIS — I10 Essential (primary) hypertension: Secondary | ICD-10-CM

## 2020-12-29 MED ORDER — HYDROCHLOROTHIAZIDE 25 MG PO TABS: 25.0000 mg | ORAL_TABLET | ORAL | 3 refills | Status: DC

## 2020-12-29 NOTE — Progress Notes (Signed)
Primary Physician/Referring:  Mancel Bale, PA-C  Patient ID: Angie Farrell, female    DOB: 1970-06-12, 50 y.o.   MRN: [id number]  Chief Complaint  Patient presents with   Follow-up   stent placement   surgery complications    HPI:    MAO LOCKNER  is a 50 y.o. Caucasian female with history of hypertension, hyperlipidemia, family history of premature CAD in her mother who was a non-smoker and nondiabetic and is late 21s and early 13s years of age.    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  Due to resistant hypertension, she underwent repeat renal arteriogram and FFR/RFR on 10/07/2020 and presents for follow-up.  She remains asymptomatic.  Since being on regular doses of spironolactone instead of as needed, also labetalol on a regular basis, blood pressure has improved significantly.  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  Presents for follow-up of hypertension and hyperlipidemia.   Patient is very sensitive to chlorthalidone causing hypotension and also carvedilol causing hypotension as well in the past. Otherwise no specific complaints today, continues to have elevated blood pressure. 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  Fortunately she has recuperated well from this.  She now presents for follow-up. NO residual  defects. Remains asymptomatic now.   Past Medical History:  Diagnosis Date   Allergy    Cancer ([place])    basal cell near collarbone   Ectopic cardiac rhythm    Essential hypertension 06/14/2017   Family history of premature CAD 06/14/2017   H/O diverticulitis of colon    Headache    History of kidney stones    hx of 8 stones   History of panic attacks 06/14/2017   Perennial allergic rhinitis 06/14/2017   PONV (postoperative nausea and vomiting)    Past Surgical History:  Procedure Laterality Date    ABDOMINAL AORTOGRAM N/A 08/19/2020   Procedure: ABDOMINAL AORTOGRAM;  Surgeon: Adrian Prows, MD;  Location: [facility];  Service: Cardiovascular;  Laterality: N/A;   INTRAVASCULAR PRESSURE WIRE/FFR STUDY Bilateral 10/07/2020   Procedure: INTRAVASCULAR PRESSURE WIRE/FFR STUDY;  Surgeon: Adrian Prows, MD;  Location: [facility];  Service: Cardiovascular;  Laterality: Bilateral;  RENAL ARTERIES   IR ANGIO INTRA EXTRACRAN SEL COM CAROTID INNOMINATE BILAT MOD SED  10/17/2020   IR ANGIO VERTEBRAL SEL VERTEBRAL BILAT MOD SED  10/17/2020   IR ANGIO VERTEBRAL SEL VERTEBRAL UNI R MOD SED  12/22/2020   IR RADIOLOGIST EVAL & MGMT  10/06/2020   IR TRANSCATH EXCRAN VERT OR CAR A STENT  12/22/2020   IR US GUIDE VASC ACCESS RIGHT  10/17/2020   LITHOTRIPSY     X 3   RADIOLOGY WITH ANESTHESIA N/A 12/22/2020   Procedure: STENTING;  Surgeon: Luanne Bras, MD;  Location: [facility];  Service: Radiology;  Laterality: N/A;   RENAL ANGIOGRAPHY N/A 08/19/2020   Procedure: RENAL ANGIOGRAPHY;  Surgeon: Adrian Prows, MD;  Location: [facility];  Service: Cardiovascular;  Laterality: N/A;   RENAL ANGIOGRAPHY N/A 10/07/2020   Procedure: RENAL ANGIOGRAPHY;  Surgeon: Adrian Prows, MD;  Location: [facility];  Service: Cardiovascular;  Laterality: N/A;   TONSILLECTOMY  1981   Family History  Problem Relation Age of Onset   Hypertension Mother    Heart disease Father 52   Hypertension Father    Tuberculosis Father  Heart attack Father    Hypertension Brother    Healthy Daughter    Drug abuse Son        heroine   Lung cancer Maternal Grandmother    Heart disease Maternal Grandmother 64   Heart attack Maternal Grandmother    Hearing loss Sister    Hyperlipidemia Sister    Hypertension Sister     Social History   Tobacco Use   Smoking status: Never   Smokeless tobacco: Never  Substance Use Topics   Alcohol use: Yes    Comment: social   Marital Status: Married   ROS  Review of Systems   Cardiovascular:  Negative for chest pain, dyspnea on exertion and leg swelling.  Gastrointestinal:  Negative for melena.   Objective  Blood pressure (!) 149/93, pulse 72, temperature 97.7 F (36.5 C), temperature source Temporal, resp. rate 17, height 5' 3"  (1.6 m), weight 126 lb 9.6 oz (57.4 kg), SpO2 99 %.  Vitals with BMI 12/29/2020 12/29/2020 12/23/2020  Height - 5' 3"  -  Weight - 126 lbs 10 oz -  BMI - 55.20 -  Systolic 802 233 612  Diastolic 93 93 80  Pulse 72 83 64      Physical Exam Constitutional:      Appearance: She is normal weight.  Neck:     Vascular: No carotid bruit or JVD.  Cardiovascular:     Rate and Rhythm: Normal rate and regular rhythm.     Pulses: Intact distal pulses.     Heart sounds: Normal heart sounds. No murmur heard.   No gallop.  Pulmonary:     Effort: Pulmonary effort is normal.     Breath sounds: Normal breath sounds.  Abdominal:     General: Bowel sounds are normal.     Palpations: Abdomen is soft.  Musculoskeletal:        General: No swelling.   Laboratory examination:   CMP Latest Ref Rng & Units 12/23/2020 12/22/2020 11/06/2020  Glucose 70 - 99 mg/dL 87 89 86  BUN 6 - 20 mg/dL 6 12 17   Creatinine 0.44 - 1.00 mg/dL 0.67 0.68 0.97  Sodium 135 - 145 mmol/L 138 139 141  Potassium 3.5 - 5.1 mmol/L 3.6 3.4(L) 4.4  Chloride 98 - 111 mmol/L 107 111 105  CO2 22 - 32 mmol/L 23 22 24   Calcium 8.9 - 10.3 mg/dL 8.4(L) 7.9(L) 9.5  Total Protein 6.0 - 8.3 g/dL - - -  Total Bilirubin 0.2 - 1.2 mg/dL - - -  Alkaline Phos 39 - 117 U/L - - -  AST 0 - 37 U/L - - -  ALT 0 - 35 U/L - - -   CBC Latest Ref Rng & Units 12/23/2020 12/22/2020 10/17/2020  WBC 4.0 - 10.5 K/uL 9.5 8.4 7.5  Hemoglobin 12.0 - 15.0 g/dL 11.3(L) 13.3 13.1  Hematocrit 36.0 - 46.0 % 33.8(L) 39.6 39.0  Platelets 150 - 400 K/uL 224 286 290   Lipid Panel     Component Value Date/Time   CHOL 171 12/23/2020 0521   CHOL 122 11/06/2020 0820   TRIG 121 12/23/2020 0521   HDL 33 (L)  12/23/2020 0521   HDL 36 (L) 11/06/2020 0820   CHOLHDL 5.2 12/23/2020 0521   VLDL 24 12/23/2020 0521   LDLCALC 114 (H) 12/23/2020 0521   LDLCALC 68 11/06/2020 0820   Component Ref Range & Units 10/02/2020   Lipoprotein (a) <75.0 nmol/L 94.4 High     HEMOGLOBIN A1C Lab Results  Component Value Date   HGBA1C 5.7 (H) 12/23/2020   MPG 116.89 12/23/2020    External labs:   04/08/2020: Total cholesterol 224, triglycerides 136, HDL 35, LDL 164  TSH 1.37, free T4 0.96  Glucose 87, BUN 12, creatinine 0.85, GFR 83, sodium 139, potassium 4.2, alk phos 100, AST 14, ALT 11  Hemoglobin 13.9, hematocrit 42.1, MCV 94, platelet 273  Urine analysis with reflex normal without proteinuria.  Medications and allergies  No Known Allergies   Outpatient Medications Prior to Visit  Medication Sig Dispense Refill   acetaminophen (TYLENOL) 500 MG tablet Take 1,000 mg by mouth every 6 (six) hours as needed for moderate pain.     ALPRAZolam (XANAX) 0.5 MG tablet Take 0.5 mg by mouth daily as needed for anxiety.     aspirin EC 81 MG tablet Take 81 mg by mouth in the morning. Swallow whole.     cloNIDine (CATAPRES) 0.1 MG tablet Take 0.1 mg by mouth daily as needed (SBP > 180 mm Hg).     clopidogrel (PLAVIX) 75 MG tablet Take 1 tablet (75 mg total) by mouth daily. 30 tablet 6   ezetimibe (ZETIA) 10 MG tablet Take 1 tablet (10 mg total) by mouth daily. 30 tablet 1   labetalol (NORMODYNE) 100 MG tablet Take 100 mg by mouth 2 (two) times daily.     rosuvastatin (CRESTOR) 20 MG tablet Take 1 tablet (20 mg total) by mouth daily. (Patient taking differently: Take 20 mg by mouth at bedtime.) 30 tablet 2   spironolactone (ALDACTONE) 50 MG tablet Take 1 tablet (50 mg total) by mouth every morning. 90 tablet 3   hydrochlorothiazide (HYDRODIURIL) 12.5 MG tablet Take 4 tablets (50 mg total) by mouth every morning. (Patient taking differently: Take 12.5 mg by mouth daily as needed (sbp 180 or higher). HALF DAILY) 30  tablet 2   No facility-administered medications prior to visit.   Medication as of today:  Current Meds  Medication Sig   acetaminophen (TYLENOL) 500 MG tablet Take 1,000 mg by mouth every 6 (six) hours as needed for moderate pain.   ALPRAZolam (XANAX) 0.5 MG tablet Take 0.5 mg by mouth daily as needed for anxiety.   aspirin EC 81 MG tablet Take 81 mg by mouth in the morning. Swallow whole.   cloNIDine (CATAPRES) 0.1 MG tablet Take 0.1 mg by mouth daily as needed (SBP > 180 mm Hg).   clopidogrel (PLAVIX) 75 MG tablet Take 1 tablet (75 mg total) by mouth daily.   ezetimibe (ZETIA) 10 MG tablet Take 1 tablet (10 mg total) by mouth daily.   labetalol (NORMODYNE) 100 MG tablet Take 100 mg by mouth 2 (two) times daily.   rosuvastatin (CRESTOR) 20 MG tablet Take 1 tablet (20 mg total) by mouth daily. (Patient taking differently: Take 20 mg by mouth at bedtime.)   spironolactone (ALDACTONE) 50 MG tablet Take 1 tablet (50 mg total) by mouth every morning.   [DISCONTINUED] hydrochlorothiazide (HYDRODIURIL) 12.5 MG tablet Take 4 tablets (50 mg total) by mouth every morning. (Patient taking differently: Take 12.5 mg by mouth daily as needed (sbp 180 or higher). HALF DAILY)     Radiology:   CT angiogram of the head and neck 08/28/2020: 1. 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 A focal fenestration is a less likely differential consideration. 2. Severe right  vertebral artery origin stenosis. 3. Small left carotid web. This finding has been reported to increase the risk of ischemic stroke. 4. No evidence of acute intracranial abnormality.  Cardiac Studies:   Bilateral renal artery duplex 05/02/2020: 1. Elevated right renal artery peak systolic velocity with renal artery to aortic ratio measuring 2.7. Findings are suggestive of renal artery  stenosis/narrowing less than 60%. 2. No evidence of any left renal artery stenosis.  3. 2.8 cm gallstone   Echocardiogram 07/09/2020: Normal LV systolic function with visual EF 60-65%. Left ventricle cavity is normal in size. Mild left ventricular hypertrophy. Normal global wall motion. Normal diastolic filling pattern, normal LAP. Mild tricuspid regurgitation. No evidence of pulmonary hypertension. No prior study for comparison.  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 This includes what  appears to be a 50% narrowing at the right renal artery origin  without significant atherosclerotic change.   Mild aortic atherosclerosis.  Aortic Atherosclerosis (ICD10-I70.0).   Coronary calcium score  07/10/2020: Coronary calcium score of 0, ascending and descending aortic measurements arenormal, Noncardiac findings include right middle lobe 2 to 3 mm nodule most likely asubpleural lymph nodes.  If high risk consider noncontrast CT in 12 months.  Renal arteriogram 08/19/2020: Right renal artery ostium has a 30% stenosis with a 10 mmHg pressure gradient.  There is appearance of beaded FMD in the right distal renal artery.  There is accessory inferior pole renal artery which is widely patent. Left renal artery has very mild beaded appearance again in the distal segment.  Left inferior pole accessory renal arteries widely patent.  Recommendation: Patient's FMD appears to be much more distal especially on the right is more prominent than the left, in view of distal renal artery involvement, potential for dissection and renal loss is high.  Potential could be to use a scoring balloon or a short chocolate balloon, not available in the hospital presently.  I will change her metoprolol to labetalol and see her back in the office and discuss options.  Selective bilateral renal arteriogram  10/07/2020: Right renal artery shows atherosclerotic 20 to 30% stenosis at the ostium.  There is mild beaded appearance of the distal radial artery suggestive of FMD.  RFR was not hemodynamically significant, no pressure gradient at all across the FMD. Left renal artery shows smooth appearance, there is mild haziness in the mid segment of the proximal left renal artery.  RFR did not reveal any hemodynamically significant pressure gradient.   Recommendation: Patient also has FMD, there is no pressure gradient and there is no hemodynamic compromise.  Suspect patient has resistant hypertension/primary hypertension.  Patient will be discharged home today with outpatient follow-up.  EKG:   EKG 12/29/2020: Sinus rhythm with short PR interval with rate of 63 bpm, normal axis, no evidence of ischemia, normal EKG. no significant change from 06/11/2020.   Assessment     ICD-10-CM   1. Resistant hypertension  I10 EKG 12-Lead    hydrochlorothiazide (HYDRODIURIL) 25 MG tablet    2. Mixed hyperlipidemia  E78.2 LDL cholesterol, direct        Medications Discontinued During This Encounter  Medication Reason   hydrochlorothiazide (HYDRODIURIL) 12.5 MG tablet Reorder    Meds ordered this encounter  Medications   hydrochlorothiazide (HYDRODIURIL) 25 MG tablet    Sig: Take 1 tablet (25 mg total) by mouth every morning.    Dispense:  90  tablet    Refill:  3     Recommendations:   KEEGAN BENSCH is a 50 y.o. Caucasian female with history of hypertension, hyperlipidemia, family history of premature CAD in her mother who was a non-smoker and nondiabetic and is late 65s and early 45s years of age.    I again reviewed the results of the repeat bilateral renal arteriogram performed on 10/07/2020 and the fact that FFR was negative for hemodynamically significant stenosis, she has very mild form of FMD. Patient underwent right vertebral artery stenting on 12/24/2020 and had a small acute infarct in the right  cerebellar hemisphere.  Fortunately she has recuperated well from this.  She now presents for follow-up.  He also had 1 episode of hypotension while in the hospital.  Patient is asymptomatic, neurologically she is completely recovered.  Blood pressure is still elevated, episode of transient hypotension probably related to postprocedural state only.  We will increase hydrochlorothiazide to 25 mg daily from 12.5 mg daily.  If blood pressure still remains elevated, we could consider increasing the dose of labetalol 250 mg twice daily.  With regard to hyperlipidemia, it is surprising that her LDL has increased from 67 also 2 months ago to the present 114, I would like to recheck LDL.  Her goal LDL is closer to 55 in view of elevated LPA as well and also because of right vertebral stenosis.  I will see her back as previously scheduled in 3 to 4 months.  She will continue with [facility] for management of hypertension in our clinic.     Adrian Prows, MD, [facility] 12/29/2020, 10:07 AM Office: [phone] Fax: [phone] Pager: [phone]

## 2020-12-31 DIAGNOSIS — I773 Arterial fibromuscular dysplasia: Secondary | ICD-10-CM | POA: Insufficient documentation

## 2021-01-05 ENCOUNTER — Other Ambulatory Visit: Payer: Self-pay

## 2021-01-05 ENCOUNTER — Ambulatory Visit (HOSPITAL_COMMUNITY)
Admission: RE | Admit: 2021-01-05 | Discharge: 2021-01-05 | Disposition: A | Discharge status: Home / Self Care | Payer: Managed Care, Other (non HMO) | Source: Ambulatory Visit | Attending: Physician Assistant | Admitting: Physician Assistant

## 2021-01-05 DIAGNOSIS — I6501 Occlusion and stenosis of right vertebral artery: Secondary | ICD-10-CM

## 2021-01-05 DIAGNOSIS — G7102 Facioscapulohumeral muscular dystrophy: Secondary | ICD-10-CM

## 2021-01-06 HISTORY — PX: IR RADIOLOGIST EVAL & MGMT: IMG5224

## 2021-01-06 LAB — LDL CHOLESTEROL, DIRECT: LDL Direct: 80 mg/dL (ref 0–99)

## 2021-01-06 NOTE — Progress Notes (Signed)
LDL better at 80 but would prefer to be closer to 55 in view of cerebral atherosclerosis and renal atherosclerosis

## 2021-01-07 ENCOUNTER — Other Ambulatory Visit: Payer: Self-pay | Admitting: *Deleted

## 2021-01-07 NOTE — Patient Outreach (Signed)
[facility]) Care Management  01/07/2021  Angie Farrell February 28, 1970 [id number]   Outgoing call placed to member to follow up on stroke recovery, no answer, HIPAA compliant voice message left.  Will follow up within the next 3-4 business days.  Valente David, RN, MSN, [place] Manager [phone]

## 2021-01-11 ENCOUNTER — Other Ambulatory Visit: Payer: Self-pay | Admitting: Cardiology

## 2021-01-11 DIAGNOSIS — E7800 Pure hypercholesterolemia, unspecified: Secondary | ICD-10-CM

## 2021-01-14 ENCOUNTER — Other Ambulatory Visit: Payer: Self-pay | Admitting: *Deleted

## 2021-01-14 NOTE — Patient Outreach (Signed)
[facility]) Care Management  01/14/2021  Angie Farrell Nov 01, 1970 [id number]   Outreach attempt #2, unsuccessful, HIPAA compliant voice message left.  Will send outreach letter and follow up within the next 3-4 business days.  Valente David, RN, MSN, [place] Manager [phone]

## 2021-01-20 ENCOUNTER — Other Ambulatory Visit: Payer: Self-pay | Admitting: *Deleted

## 2021-01-20 NOTE — Patient Outreach (Signed)
[facility]) Care Management  01/20/2021  Angie Farrell Dec 12, 1970 [id number]   Outreach attempt #3, unsuccessful, HIPAA compliant voice message left.  Will make 4th and final attempt within the next 4 weeks, if remain unsuccessful will close case due to inability to maintain contact.  Valente David, RN, MSN, [place] Manager [phone]

## 2021-01-29 ENCOUNTER — Other Ambulatory Visit: Payer: Self-pay | Admitting: Pharmacist

## 2021-01-29 DIAGNOSIS — E782 Mixed hyperlipidemia: Secondary | ICD-10-CM

## 2021-01-29 DIAGNOSIS — Z8249 Family history of ischemic heart disease and other diseases of the circulatory system: Secondary | ICD-10-CM

## 2021-01-29 DIAGNOSIS — I700 Atherosclerosis of aorta: Secondary | ICD-10-CM

## 2021-01-29 MED ORDER — ROSUVASTATIN CALCIUM 40 MG PO TABS: 40.0000 mg | ORAL_TABLET | Freq: Every day | ORAL | 3 refills | Status: DC

## 2021-01-29 NOTE — Telephone Encounter (Signed)
ICD-10-CM   1. Family history of premature CAD  Z82.49 rosuvastatin (CRESTOR) 40 MG tablet    Lipid Panel With LDL/HDL Ratio    2. Mixed hyperlipidemia  E78.2 rosuvastatin (CRESTOR) 40 MG tablet    Lipid Panel With LDL/HDL Ratio    3. Aortic atherosclerosis (H[facility])  I70.0 rosuvastatin (CRESTOR) 40 MG tablet    Lipid Panel With LDL/HDL Ratio     Meds ordered this encounter  Medications   rosuvastatin (CRESTOR) 40 MG tablet    Sig: Take 1 tablet (40 mg total) by mouth daily.    Dispense:  90 tablet    Refill:  3    Medications Discontinued During This Encounter  Medication Reason   rosuvastatin (CRESTOR) 20 MG tablet      Adrian Prows, MD, [facility] 01/29/2021, 6:45 PM Office: [phone] Fax: [phone] Pager: [phone]

## 2021-02-16 ENCOUNTER — Other Ambulatory Visit: Payer: Self-pay | Admitting: *Deleted

## 2021-02-16 NOTE — Patient Outreach (Signed)
[facility]) Care Management  02/16/2021  [name] [birth year] [id number]   Outreach attempt #4, unsuccessful, HIPAA compliant voice message left.  No response from member after multiple unsuccessful outreach attempts and letter sent.  Will close case at this time due to inability to maintain contact.  Will notify member of case closure.  Valente David, RN, MSN, [place] Manager [phone]

## 2021-03-10 ENCOUNTER — Telehealth (HOSPITAL_COMMUNITY): Payer: Self-pay | Admitting: Radiology

## 2021-03-10 NOTE — Telephone Encounter (Signed)
Returned pt's call about when her f/u is due.. I told her she is due for a CTA head/neck in April 2023. She would like to go ahead and get the insurance authorization and get the scan scheduled due to her work schedule. We will send that request in now and call her to schedule once we get the auth back. Pt agrees with this plan of care. JM

## 2021-03-12 ENCOUNTER — Other Ambulatory Visit (HOSPITAL_COMMUNITY): Payer: Self-pay | Admitting: Interventional Radiology

## 2021-03-12 DIAGNOSIS — I6501 Occlusion and stenosis of right vertebral artery: Secondary | ICD-10-CM

## 2021-03-20 DIAGNOSIS — I773 Arterial fibromuscular dysplasia: Secondary | ICD-10-CM | POA: Insufficient documentation

## 2021-04-21 ENCOUNTER — Ambulatory Visit (HOSPITAL_COMMUNITY)
Admission: RE | Admit: 2021-04-21 | Discharge: 2021-04-21 | Disposition: A | Discharge status: Home / Self Care | Payer: Managed Care, Other (non HMO) | Source: Ambulatory Visit | Attending: Interventional Radiology | Admitting: Interventional Radiology

## 2021-04-21 DIAGNOSIS — I6501 Occlusion and stenosis of right vertebral artery: Secondary | ICD-10-CM | POA: Insufficient documentation

## 2021-04-21 MED ORDER — IOHEXOL 350 MG/ML SOLN
75.0000 mL | Freq: Once | INTRAVENOUS | Status: AC | PRN
  Administered 2021-04-21: 75 mL via INTRAVENOUS

## 2021-04-21 MED ORDER — SODIUM CHLORIDE (PF) 0.9 % IJ SOLN
INTRAMUSCULAR | Status: AC
  Filled 2021-04-21: qty 50

## 2021-04-22 ENCOUNTER — Ambulatory Visit: Payer: Managed Care, Other (non HMO) | Admitting: Cardiology

## 2021-04-22 ENCOUNTER — Encounter: Payer: Self-pay | Admitting: Cardiology

## 2021-04-22 VITALS — BP 136/86 | HR 59 | Temp 98.1°F | Resp 16 | Ht 63.0 in | Wt 126.2 lb

## 2021-04-22 DIAGNOSIS — I10 Essential (primary) hypertension: Secondary | ICD-10-CM

## 2021-04-22 DIAGNOSIS — I773 Arterial fibromuscular dysplasia: Secondary | ICD-10-CM

## 2021-04-22 DIAGNOSIS — E782 Mixed hyperlipidemia: Secondary | ICD-10-CM

## 2021-04-22 DIAGNOSIS — I700 Atherosclerosis of aorta: Secondary | ICD-10-CM

## 2021-04-22 MED ORDER — EZETIMIBE 10 MG PO TABS: 10.0000 mg | ORAL_TABLET | Freq: Every day | ORAL | 3 refills | Status: DC

## 2021-04-22 MED ORDER — ATORVASTATIN CALCIUM 40 MG PO TABS: 40.0000 mg | ORAL_TABLET | Freq: Every day | ORAL | 3 refills | Status: AC

## 2021-04-22 NOTE — Progress Notes (Signed)
? ?Primary Physician/Referring:  Mancel Bale, PA-C ? ?Patient ID: Angie Farrell, female    DOB: 1970/06/07, 51 y.o.   MRN: [id number] ? ?Chief Complaint  ?Patient presents with  ? Resistant hypertension  ? Hyperlipidemia  ? Follow-up  ?  6 months  ? ? ?HPI:   ? ?Angie Farrell  is a 51 y.o. Caucasian female with history of hypertension, hyperlipidemia, family history of premature CAD in her mother who was a non-smoker and nondiabetic and is late 74s and early 68s years of age.  She also has mild carotid atherosclerosis and also right vertebral artery stenosis SP stenting 12/24/2020 being followed by interventional radiology.  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 ? ?Remains asymptomatic now except for occasional dizziness.  ? ?Past Medical History:  ?Diagnosis Date  ? Allergy   ? Cancer [facility])   ? 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 Ectopic cardiac rhythm   ? Essential hypertension 06/14/2017  ? Family history of premature CAD 06/14/2017  ? H/O diverticulitis of colon   ? Headache   ? History of kidney stones   ? hx of 8 stones  ? History of panic attacks 06/14/2017  ? Perennial allergic rhinitis 06/14/2017  ? PONV (postoperative nausea and vomiting)   ? ? ?Family History  ?Problem Relation Age of Onset  ? Hypertension Mother   ? Heart disease Father 51  ? Hypertension Father   ? Tuberculosis Father   ? Heart attack Father   ? Hypertension Brother   ? Healthy Daughter   ? Drug abuse Son   ?     heroine  ? Lung cancer Maternal Grandmother   ? Heart disease Maternal Grandmother 30  ? Heart attack Maternal Grandmother   ? Hearing loss Sister   ? Hyperlipidemia Sister   ? Hypertension Sister   ?  ?Social History  ? ?Tobacco Use  ? Smoking status: Never  ? Smokeless tobacco: Never  ?Substance Use Topics  ? Alcohol use: Yes  ?  Comment: social  ? ?Marital Status: Married  ? ?ROS  ?Review of Systems  ?Cardiovascular:  Negative  for chest pain, dyspnea on exertion and leg swelling.  ?Gastrointestinal:  Negative for melena.  ? ?Objective  ?Blood pressure 136/86, pulse (!) 59, temperature 98.1 ?F (36.7 ?C), temperature source Temporal, resp. rate 16, height 5' 3"  (1.6 m), weight 126 lb 3.2 oz (57.2 kg), SpO2 99 %.  ? ?  04/22/2021  ?  8:57 AM 12/29/2020  ?  9:19 AM 12/29/2020  ?  9:06 AM  ?Vitals with BMI  ?Height 5' 3"   5' 3"   ?Weight 126 lbs 3 oz  126 lbs 10 oz  ?BMI 22.36  22.43  ?Systolic 867 672 094  ?Diastolic 86 93 93  ?Pulse 59 72 83  ?  ? ? Physical Exam ?Constitutional:   ?   Appearance: She is normal weight.  ?Neck:  ?   Vascular: No carotid bruit or JVD.  ?Cardiovascular:  ?   Rate and Rhythm: Normal rate and regular rhythm.  ?   Pulses: Intact distal pulses.  ?   Heart sounds: Normal heart sounds. No murmur heard. ?  No gallop.  ?Pulmonary:  ?   Effort: Pulmonary effort is normal.  ?   Breath sounds: Normal breath sounds.  ?Abdominal:  ?   General: Bowel sounds are normal.  ?   Palpations: Abdomen  is soft.  ?Musculoskeletal:     ?   General: No swelling.  ? ?Laboratory examination:  ? ? ?  Latest Ref Rng & Units 12/23/2020  ?  5:21 AM 12/22/2020  ?  7:44 AM 11/06/2020  ?  8:20 AM  ?CMP  ?Glucose 70 - 99 mg/dL 87   89   86    ?BUN 6 - 20 mg/dL 6   12   17     ?Creatinine 0.44 - 1.00 mg/dL 0.67   0.68   0.97    ?Sodium 135 - 145 mmol/L 138   139   141    ?Potassium 3.5 - 5.1 mmol/L 3.6   3.4   4.4    ?Chloride 98 - 111 mmol/L 107   111   105    ?CO2 22 - 32 mmol/L 23   22   24     ?Calcium 8.9 - 10.3 mg/dL 8.4   7.9   9.5    ? ? ?  Latest Ref Rng & Units 12/23/2020  ?  5:21 AM 12/22/2020  ?  6:45 AM 10/17/2020  ?  8:21 AM  ?CBC  ?WBC 4.0 - 10.5 K/uL 9.5   8.4   7.5    ?Hemoglobin 12.0 - 15.0 g/dL 11.3   13.3   13.1    ?Hematocrit 36.0 - 46.0 % 33.8   39.6   39.0    ?Platelets 150 - 400 K/uL 224   286   290    ? ?Lipid Panel  ?   ?Component Value Date/Time  ? CHOL 171 12/23/2020 0521  ? CHOL 122 11/06/2020 0820  ? TRIG 121 12/23/2020 0521  ?  HDL 33 (L) 12/23/2020 0521  ? HDL 36 (L) 11/06/2020 0820  ? CHOLHDL 5.2 12/23/2020 0521  ? VLDL 24 12/23/2020 0521  ? [place] 114 (H) 12/23/2020 0521  ? [place] 68 11/06/2020 0820  ? LDLDIRECT 80 01/05/2021 1157  ? ?Component Ref Range & Units 10/02/2020   ?Lipoprotein (a) <75.0 nmol/L 94.4 High    ? ?HEMOGLOBIN A1C ?Lab Results  ?Component Value Date  ? HGBA1C 5.7 (H) 12/23/2020  ? MPG 116.89 12/23/2020  ? ? ?External labs:  ? ?04/08/2020: ?Total cholesterol 224, triglycerides 136, HDL 35, LDL 164 ? ?TSH 1.37, free T4 0.96 ? ?Glucose 87, BUN 12, creatinine 0.85, GFR 83, sodium 139, potassium 4.2, alk phos 100, AST 14, ALT 11 ? ?Hemoglobin 13.9, hematocrit 42.1, MCV 94, platelet 273 ? ?Urine analysis with reflex normal without proteinuria. ? ?Medications and allergies  ?No Known Allergies  ? ?Medication as of today:  ? ?Current Outpatient Medications:  ?  ALPRAZolam (XANAX) 0.5 MG tablet, Take 0.5 mg by mouth daily as needed for anxiety., Disp: , Rfl:  ?  aspirin EC 81 MG tablet, Take 81 mg by mouth in the morning. Swallow whole., Disp: , Rfl:  ?  atorvastatin (LIPITOR) 40 MG tablet, Take 1 tablet (40 mg total) by mouth daily., Disp: 90 tablet, Rfl: 3 ?  cloNIDine (CATAPRES) 0.1 MG tablet, Take 0.1 mg by mouth daily as needed (SBP > 180 mm Hg)., Disp: , Rfl:  ?  clopidogrel (PLAVIX) 75 MG tablet, Take 1 tablet (75 mg total) by mouth daily., Disp: 30 tablet, Rfl: 6 ?  ezetimibe (ZETIA) 10 MG tablet, Take 1 tablet (10 mg total) by mouth daily., Disp: 90 tablet, Rfl: 3 ?  hydrochlorothiazide (HYDRODIURIL) 25 MG tablet, Take 1 tablet (25 mg total) by mouth every morning., Disp: 90  tablet, Rfl: 3 ?  labetalol (NORMODYNE) 100 MG tablet, Take 100 mg by mouth 2 (two) times daily., Disp: , Rfl:  ?  spironolactone (ALDACTONE) 50 MG tablet, Take 1 tablet (50 mg total) by mouth every morning., Disp: 90 tablet, Rfl: 3  ?Radiology:  ? ?CT angiogram of the head and neck 08/28/2020: ?1. Findings concerning for age indeterminate  dissection of the right ?vertebral artery at the C5 level with opacification of the false and ?true lumens and approximately 40% stenosis of the true lumen. A ?focal fenestration is a less likely differential consideration. ?2. Severe right vertebral artery origin stenosis. ?3. Small left carotid web. This finding has been reported to ?increase the risk of ischemic stroke. ?4. No evidence of acute intracranial abnormality. ? ?Cardiac Studies:  ? ?Bilateral renal artery duplex 05/02/2020: ?1. Elevated right renal artery peak systolic velocity with renal artery to aortic ratio measuring 2.7. Findings are suggestive of renal artery stenosis/narrowing less than 60%. ?2. No evidence of any left renal artery stenosis.  ?3. 2.8 cm gallstone  ? ?Echocardiogram 07/09/2020: ?Normal LV systolic function with visual EF 60-65%. Left ventricle cavity is normal in size. Mild left ventricular hypertrophy. Normal global wall motion. Normal diastolic filling pattern, normal LAP. ?Mild tricuspid regurgitation. No evidence of pulmonary hypertension. ?No prior study for comparison. ? ?CT angiogram of the abdomen with contrast 07/01/2020: ?The CT angiogram is positive for subtle findings of fibromuscular  ?dysplasia of the bilateral renal arteries. This includes what  ?appears to be a 50% narrowing at the right renal artery origin  ?without significant atherosclerotic change.  ? ?Mild aortic atherosclerosis.  Aortic Atherosclerosis (ICD10-I70.0).  ? ?Coronary calcium score  07/10/2020: ?Coronary calcium score of 0, ascending and descending aortic measurements arenormal, ?Noncardiac findings include right middle lobe 2 to 3 mm nodule most likely asubpleural lymph nodes.  If high risk consider noncontrast CT in 12 months. ? ?Renal arteriogram 08/19/2020: ?Right renal artery ostium has a 30% stenosis with a 10 mmHg pressure gradient.  There is appearance of beaded FMD in the right distal renal artery.  There is accessory inferior pole renal  artery which is widely patent. ?Left renal artery has very mild beaded appearance again in the distal segment.  Left inferior pole accessory renal arteries widely patent. ? ?Recommendation: Patient's FMD app

## 2021-04-27 ENCOUNTER — Telehealth (HOSPITAL_COMMUNITY): Payer: Self-pay

## 2021-04-27 NOTE — Telephone Encounter (Signed)
Pt agreed to f/u in 6 months with an cta head/neck. AW  ?

## 2021-05-18 DIAGNOSIS — R918 Other nonspecific abnormal finding of lung field: Secondary | ICD-10-CM | POA: Insufficient documentation

## 2021-05-18 DIAGNOSIS — K8020 Calculus of gallbladder without cholecystitis without obstruction: Secondary | ICD-10-CM | POA: Insufficient documentation

## 2021-05-18 DIAGNOSIS — E041 Nontoxic single thyroid nodule: Secondary | ICD-10-CM | POA: Insufficient documentation

## 2021-07-18 ENCOUNTER — Other Ambulatory Visit: Payer: Self-pay | Admitting: Cardiology

## 2021-07-18 DIAGNOSIS — I10 Essential (primary) hypertension: Secondary | ICD-10-CM

## 2021-11-04 ENCOUNTER — Other Ambulatory Visit (HOSPITAL_COMMUNITY): Payer: Self-pay | Admitting: Interventional Radiology

## 2021-11-04 DIAGNOSIS — I771 Stricture of artery: Secondary | ICD-10-CM

## 2021-11-17 ENCOUNTER — Ambulatory Visit
Admission: RE | Admit: 2021-11-17 | Discharge: 2021-11-17 | Disposition: A | Discharge status: Home / Self Care | Payer: Managed Care, Other (non HMO) | Source: Ambulatory Visit | Attending: Interventional Radiology | Admitting: Interventional Radiology

## 2021-11-17 DIAGNOSIS — I771 Stricture of artery: Secondary | ICD-10-CM

## 2021-11-17 MED ORDER — IOPAMIDOL (ISOVUE-370) INJECTION 76%
75.0000 mL | Freq: Once | INTRAVENOUS | Status: AC | PRN
  Administered 2021-11-17: 75 mL via INTRAVENOUS

## 2021-11-23 ENCOUNTER — Telehealth (HOSPITAL_COMMUNITY): Payer: Self-pay

## 2021-11-23 NOTE — Telephone Encounter (Signed)
Pt agreed to f/u in 6 months with a cta head/neck. AW 

## 2022-02-01 DIAGNOSIS — K269 Duodenal ulcer, unspecified as acute or chronic, without hemorrhage or perforation: Secondary | ICD-10-CM | POA: Insufficient documentation

## 2022-04-23 ENCOUNTER — Ambulatory Visit: Payer: Managed Care, Other (non HMO) | Admitting: Cardiology

## 2022-05-19 ENCOUNTER — Ambulatory Visit: Payer: Managed Care, Other (non HMO) | Admitting: Cardiology

## 2022-06-16 ENCOUNTER — Other Ambulatory Visit (HOSPITAL_COMMUNITY): Payer: Self-pay | Admitting: Interventional Radiology

## 2022-06-16 ENCOUNTER — Telehealth (HOSPITAL_COMMUNITY): Payer: Self-pay

## 2022-06-16 DIAGNOSIS — I771 Stricture of artery: Secondary | ICD-10-CM

## 2022-06-16 NOTE — Telephone Encounter (Signed)
Called to schedule cta, no answer, left vm. AB 

## 2022-07-12 IMAGING — CT CT ANGIO HEAD
1 of 11 series · 5 of 33 positions shown · non-contrast
Comparison: 12/22/2020

CLINICAL DATA: Stenosis of right vertebral artery

EXAM:
CT ANGIOGRAPHY HEAD AND NECK
TECHNIQUE: Multidetector CT imaging of the head and neck was performed using
the standard protocol during bolus administration of intravenous
contrast. Multiplanar CT image reconstructions and MIPs were
obtained to evaluate the vascular anatomy. Carotid stenosis
measurements (when applicable) are obtained utilizing NASCET
criteria, using the distal internal carotid diameter as the
denominator.

[Series 12: ax thin · axial · 0.35mm/px · z∈[-344,-100]mm · 5 of 367 slices shown]
[im 62/367  soft-tissue]
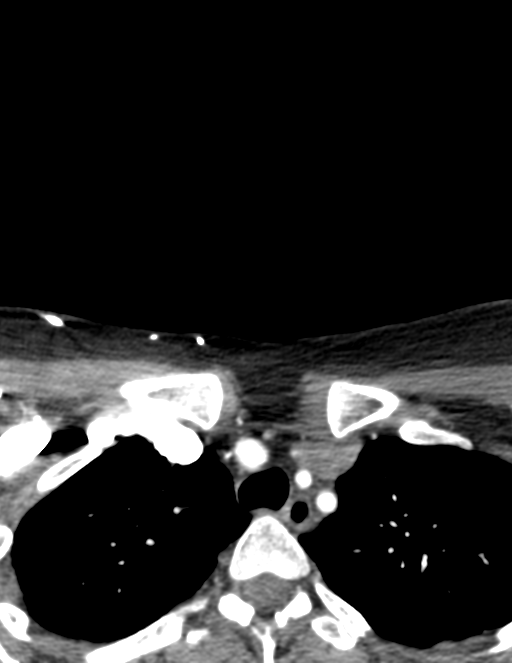
[im 123/367  bone]
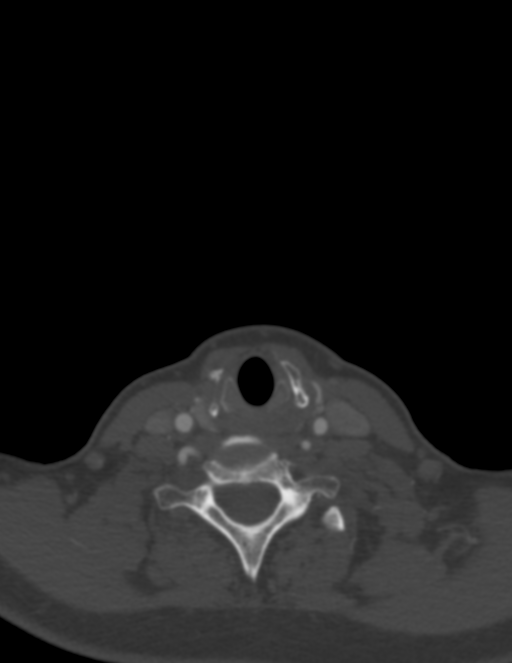
[im 184/367  soft-tissue]
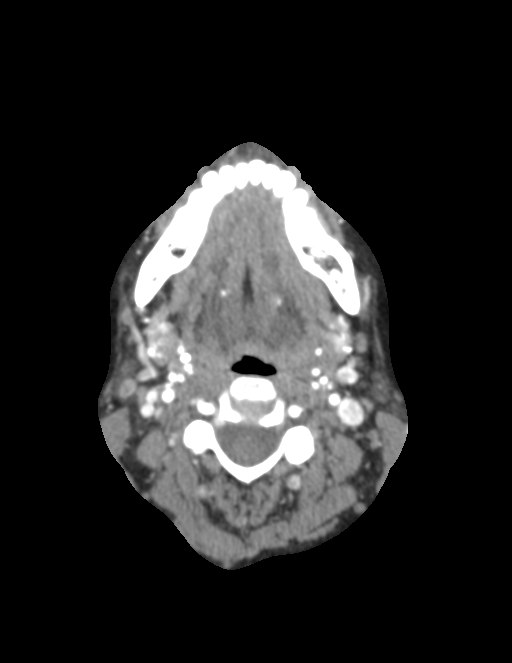
[im 245/367  bone]
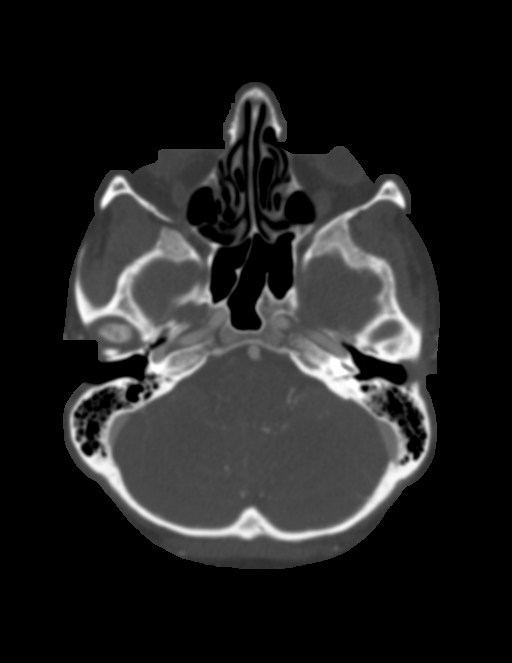
[im 306/367  soft-tissue]
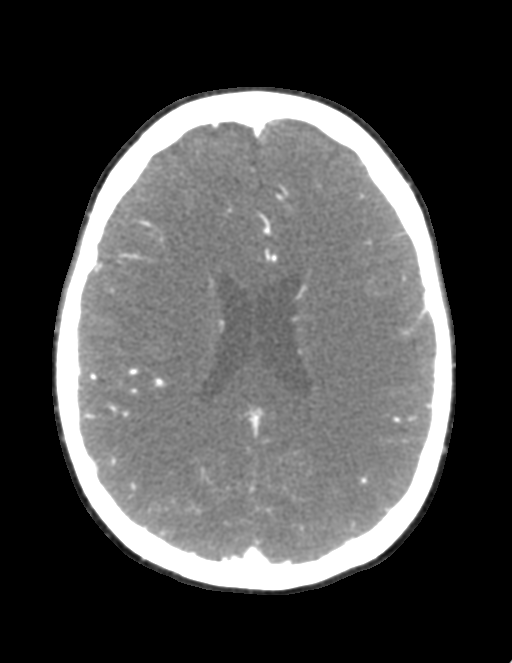

[5 of 33 positions shown; findings below may reference images not displayed]

RADIATION DOSE REDUCTION: This exam was performed according to the
departmental dose-optimization program which includes automated
exposure control, adjustment of the mA and/or kV according to
patient size and/or use of iterative reconstruction technique.

CONTRAST:  75mL OMNIPAQUE IOHEXOL 350 MG/ML SOLN
FINDINGS: CT HEAD FINDINGS

Brain: No evidence of acute or interval infarction, hemorrhage,
hydrocephalus, extra-axial collection or mass lesion/mass effect.

Vascular: See below

Skull: Normal. Negative for fracture or focal lesion.

Sinuses: Imaged portions are clear.

Orbits: Negative

Review of the MIP images confirms the above findings

CTA NECK FINDINGS

Aortic arch: Normal.  Three vessel branching.

Right carotid system: Streak artifact affects the brachiocephalic
bifurcation. Low-density plaque at the distal common carotid and
proximal ICA without flow limiting stenosis or ulceration.

Left carotid system: Mild low-density plaque at the distal common
carotid and bifurcation.

Vertebral arteries: No proximal subclavian stenosis when accounting
for artifact at the right subclavian origin. Right V1 segment stent
which is widely patent. Linear defect in the right vertebral artery
at the C5 level, again most likely fenestration given smooth
appearance. The vertebral arteries are tortuous but smoothly
contoured.

Skeleton: Negative

Other neck: Negative

Upper chest: Negative

Review of the MIP images confirms the above findings

CTA HEAD FINDINGS

Anterior circulation: Mild calcified plaque along the carotid
siphons. No branch occlusion, beading, or flow limiting stenosis.
Negative for aneurysm

Posterior circulation: The vertebral and basilar arteries are
smoothly contoured and widely patent. No branch occlusion, beading,
or aneurysm

Venous sinuses: Diffusely patent.

Anatomic variants: Azygous A2.

Review of the MIP images confirms the above findings
IMPRESSION: 1. Stable compared to 12/22/2020. No evidence of right vertebral
stent complication.
2. Mild atherosclerosis in the cervical carotids.

## 2022-07-13 ENCOUNTER — Ambulatory Visit (HOSPITAL_COMMUNITY)
Admission: RE | Admit: 2022-07-13 | Discharge: 2022-07-13 | Disposition: A | Discharge status: Home / Self Care | Payer: 59 | Source: Ambulatory Visit | Attending: Interventional Radiology | Admitting: Interventional Radiology

## 2022-07-13 DIAGNOSIS — I771 Stricture of artery: Secondary | ICD-10-CM | POA: Insufficient documentation

## 2022-07-13 MED ORDER — IOHEXOL 350 MG/ML SOLN
75.0000 mL | Freq: Once | INTRAVENOUS | Status: AC | PRN
  Administered 2022-07-13: 75 mL via INTRAVENOUS

## 2022-07-27 ENCOUNTER — Telehealth (HOSPITAL_COMMUNITY): Payer: Self-pay

## 2022-07-27 NOTE — Telephone Encounter (Signed)
Called pt regarding recent imaging, no answer, left vm. AB  

## 2022-07-27 NOTE — Telephone Encounter (Signed)
Pt agreed to f/u in 2 years with a cta head/neck. AB 

## 2022-09-08 ENCOUNTER — Ambulatory Visit: Payer: 59 | Admitting: Neurology

## 2023-01-20 ENCOUNTER — Telehealth: Payer: Self-pay | Admitting: Neurology

## 2023-01-20 ENCOUNTER — Ambulatory Visit: Payer: 59 | Admitting: Neurology

## 2023-01-20 NOTE — Telephone Encounter (Signed)
 Pt cancelled appt due to woke up not feeling well. Informed pt there would be a show fee of $50 and can discuss with Billing. Pt hung while being transferred to Billing.

## 2023-07-11 ENCOUNTER — Telehealth: Payer: Self-pay | Admitting: Neurology

## 2023-07-11 NOTE — Telephone Encounter (Signed)
 LVM and sent mychart msg informing pt of need to reschedule 10/24/23 appt - MD out

## 2023-07-15 ENCOUNTER — Encounter (HOSPITAL_COMMUNITY): Payer: Self-pay | Admitting: Interventional Radiology

## 2023-08-29 ENCOUNTER — Telehealth (HOSPITAL_COMMUNITY): Payer: Self-pay

## 2023-08-29 NOTE — Telephone Encounter (Signed)
 Pt agreed to discuss future f/u's with neurology since Dr. Dolphus is no longer here. AB

## 2023-10-20 ENCOUNTER — Encounter: Payer: Self-pay | Admitting: Neurology

## 2023-10-20 ENCOUNTER — Ambulatory Visit: Admitting: Neurology

## 2023-10-20 VITALS — BP 157/93 | HR 75 | Resp 14 | Ht 64.0 in | Wt 130.0 lb

## 2023-10-20 DIAGNOSIS — G43709 Chronic migraine without aura, not intractable, without status migrainosus: Secondary | ICD-10-CM | POA: Diagnosis not present

## 2023-10-20 DIAGNOSIS — I773 Arterial fibromuscular dysplasia: Secondary | ICD-10-CM | POA: Diagnosis not present

## 2023-10-20 MED ORDER — SUMATRIPTAN SUCCINATE 25 MG PO TABS: 25.0000 mg | ORAL_TABLET | ORAL | 6 refills | Status: AC | PRN

## 2023-10-20 NOTE — Progress Notes (Signed)
 Chief Complaint  Patient presents with   New Patient (Initial Visit)    Rm14, alone, [address] Angie Farrell [phone]/[id number] dysplasia: pt stated that she is here only for her ice pick sensation ha's ongoing since jan. Pt reported that she hsa 20/30 days w/ha. Triggers: unidentifiable      ASSESSMENT AND PLAN  Angie Farrell is a 53 y.o. female   known history of fibromuscular dysplasia   Hx of severe right vertebral artery stenosis at its origin, underwent endovascular revascularization with stent assisted angioplasty by Dr. Dolphus in December 2022, on aspirin , pending repeat CT angiogram head and neck Chronic migraine Intermittent thunderclap headache, likely migraine variant, educate patient about potential trigger, as needed NSAIDs, Imitrex 25 mg as needed for more prolonged severe headaches   Continue follow-up with primary care and return to clinic for new issues DIAGNOSTIC DATA (LABS, IMAGING, TESTING) - I reviewed patient records, labs, notes, testing and imaging myself where available.   MEDICAL HISTORY:  Angie Farrell, is a 53 year old female, seen in request by her primary care PA Weber, Lauraine for evaluation of fibromuscular dysplasia, intermittent headache, initial evaluation October 20, 2023.  Since  History is obtained from the patient and review of electronic medical records. I personally reviewed pertinent available imaging films in PACS.   PMHx of  HTN Stroke HLD  Patient was noted to have labile hypertension around 2022, had extensive nephrology cardiology evaluation then, CT angiogram of abdomen in June 2022 showed subtle findings of fibromuscular dysplasia of bilateral renal artery, 50% narrowing at the right renal artery origin without significant atherosclerotic changes,  CT cardiac scoring showed no coronary calcified, aortic atherosclerosis  Evaluation also demonstrated preocclusive severe right vertebral artery stenosis at its  origin, underwent endovascular revascularization with stent assisted angioplasty by Dr. 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  She is under close supervision of fibromuscular dysplasia specialist at the [place] , tight control of her vascular risk factor, physically active, work from home, no limitations,  She had frequent migraines in her 55s to 30s, described severe retro-orbital area headache with light noise sensitivity, nauseous, but her headache has much improved since menopause,  Since beginning of 2025, she had intermittent sharp transient often left retro-orbital area or severe pain lasting few minutes, occasionally linger around longer, do response to Tylenol  as needed for a while, she can have up to 20 headache in the month, now has improved, only occasionally  Most recent CT angiogram head and neck was in July 2024, showed patent right vertebral artery V1 segment, low-density plaque at both carotid bifurcation without stenosis, has repeat CT angiogram planned for 2025 PHYSICAL EXAM:   Vitals:   10/20/23 0810 10/20/23 0816  BP: (!) 162/95 (!) 157/93  Pulse: 75   Resp: 14   SpO2: 96%   Weight: 130 lb (59 kg)   Height: 5' 4 (1.626 m)    Body mass index is 22.31 kg/m.  PHYSICAL EXAMNIATION:  Gen: NAD, conversant, well nourised, well groomed                     Cardiovascular: Regular rate rhythm, no peripheral edema, warm, nontender. Eyes: Conjunctivae clear without exudates or hemorrhage Neck: Supple, no carotid bruits. Pulmonary: Clear to auscultation bilaterally   NEUROLOGICAL EXAM:  MENTAL STATUS: Speech/cognition: Awake, alert, oriented to history taking and casual  conversation CRANIAL NERVES: CN II: Visual fields are full to confrontation. Pupils are round equal and briskly reactive to light.  Funduscopy examination  were normal bilaterally CN III, IV, VI: extraocular movement are normal. No ptosis. CN V: Facial sensation is intact to light touch CN VII: Face is symmetric with normal eye closure  CN VIII: Hearing is normal to causal conversation. CN IX, X: Phonation is normal. CN XI: Head turning and shoulder shrug are intact  MOTOR: There is no pronator drift of out-stretched arms. Muscle bulk and tone are normal. Muscle strength is normal.  REFLEXES: Reflexes are 2+ and symmetric at the biceps, triceps, knees, and ankles. Plantar responses are flexor.  SENSORY: Intact to light touch, pinprick and vibratory sensation are intact in fingers and toes.  COORDINATION: There is no trunk or limb dysmetria noted.  GAIT/STANCE: Posture is normal. Gait is steady with normal steps, base, arm swing, and turning. Heel and toe walking are normal. Tandem gait is normal.  Romberg is absent.  REVIEW OF SYSTEMS:  Full 14 system review of systems performed and notable only for as above All other review of systems were negative.   ALLERGIES: No Known Allergies  HOME MEDICATIONS: Current Outpatient Medications  Medication Sig Dispense Refill   ALPRAZolam (XANAX) 0.5 MG tablet Take 0.5 mg by mouth daily as needed for anxiety.     atorvastatin  (LIPITOR) 40 MG tablet Take 1 tablet (40 mg total) by mouth daily. 90 tablet 3   cloNIDine (CATAPRES) 0.1 MG tablet Take 0.1 mg by mouth daily as needed (SBP > 180 mm Hg).     metoprolol succinate (TOPROL-XL) 50 MG 24 hr tablet Take 50 mg by mouth daily.     spironolactone  (ALDACTONE ) 50 MG tablet Take 1 tablet (50 mg total) by mouth every morning. 90 tablet 3   No current facility-administered medications for this visit.    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   Ectopic cardiac rhythm    Essential hypertension 06/14/2017   Family history of premature CAD 06/14/2017   H/O  diverticulitis of colon    Headache    History of kidney stones    hx of 8 stones   History of panic attacks 06/14/2017   Perennial allergic rhinitis 08/29/2012   PONV (postoperative nausea and vomiting)     PAST SURGICAL HISTORY: Past Surgical History:  Procedure Laterality Date   ABDOMINAL AORTOGRAM N/A 08/19/2020   Procedure: ABDOMINAL AORTOGRAM;  Surgeon: Ladona Heinz, MD;  Location: [facility];  Service: Cardiovascular;  Laterality: N/A;   CORONARY PRESSURE/FFR STUDY Bilateral 10/07/2020   Procedure: INTRAVASCULAR PRESSURE WIRE/FFR STUDY;  Surgeon: Ladona Heinz, MD;  Location: [facility];  Service: Cardiovascular;  Laterality: Bilateral;  RENAL ARTERIES   IR ANGIO INTRA EXTRACRAN SEL COM CAROTID INNOMINATE BILAT MOD SED  10/17/2020   IR ANGIO VERTEBRAL SEL VERTEBRAL BILAT MOD SED  10/17/2020   IR ANGIO VERTEBRAL SEL VERTEBRAL UNI R MOD SED  12/22/2020   IR RADIOLOGIST EVAL & MGMT  10/06/2020   IR RADIOLOGIST EVAL & MGMT  01/06/2021   IR TRANSCATH EXCRAN VERT OR CAR A STENT  12/22/2020   IR US  GUIDE VASC ACCESS RIGHT  10/17/2020   LITHOTRIPSY     X 3   RADIOLOGY WITH ANESTHESIA N/A 12/22/2020   Procedure: STENTING;  Surgeon: Dolphus Carrion, MD;  Location: [facility];  Service:  Radiology;  Laterality: N/A;   RENAL ANGIOGRAPHY N/A 08/19/2020   Procedure: RENAL ANGIOGRAPHY;  Surgeon: Ladona Heinz, MD;  Location: [facility];  Service: Cardiovascular;  Laterality: N/A;   RENAL ANGIOGRAPHY N/A 10/07/2020   Procedure: RENAL ANGIOGRAPHY;  Surgeon: Ladona Heinz, MD;  Location: [facility];  Service: Cardiovascular;  Laterality: N/A;   TONSILLECTOMY  1981    FAMILY HISTORY: Family History  Problem Relation Age of Onset   Hypertension Mother    Heart disease Father 64   Hypertension Father    Tuberculosis Father    Heart attack Father    Hypertension Brother    Healthy Daughter    Drug abuse Son        heroine   Lung cancer Maternal Grandmother    Heart disease  Maternal Grandmother 45   Heart attack Maternal Grandmother    Hearing loss Sister    Hyperlipidemia Sister    Hypertension Sister     SOCIAL HISTORY: Social History   Socioeconomic History   Marital status: Married    Spouse name: Not on file   Number of children: 2   Years of education: Not on file   Highest education level: Not on file  Occupational History   Occupation: Armed forces training and education officer, 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    Husband works for [employer]   2 grown children; live in [place]   Social Drivers of [employer]: Low Risk  (09/12/2023)   Received from [employer]   Overall Financial Resource Strain (CARDIA)    How hard is it for you to pay for the very basics like food, housing, medical care, and heating?: Not hard at all  Food Insecurity: No Food Insecurity (09/12/2023)   Received from [facility]   Hunger Vital Sign    Within the past 12 months, you worried that your food would run out before you got the money to buy more.: Never true    Within the past 12 months, the food you bought just didn't last and you didn't have money to get more.: Never true  Transportation Needs: No Transportation Needs (09/12/2023)   Received from [facility] - Transportation    In the past 12 months, has lack of transportation kept you from medical appointments or from getting medications?: No    In the past 12 months, has lack of transportation kept you from meetings, work, or from getting things needed for daily  living?: No  Physical Activity: Unknown (09/12/2023)   Received [facility]   Exercise Vital Sign    On average, how many days per week do you engage in moderate to strenuous exercise (like a brisk walk)?: 4 days    Minutes of Exercise per Session: Not on file  Stress: Stress Concern Present (09/12/2023)   Received from [facility] - Occupational Stress Questionnaire    Do you  feel stress - tense, restless, nervous, or anxious, or unable to sleep at night because your mind is troubled all the time - these days?: To some extent  Social Connections: Moderately Integrated (09/12/2023)   Received from [facility]   Social Network    How would you rate your social network (family, work, friends)?: Adequate participation with social networks  Intimate Partner Violence: Not At Risk (09/12/2023)   Received from [facility]   HITS    Over the last 12 months how often did your partner physically hurt you?: Never    Over the last 12 months how often did your partner insult you or talk down to you?: Never    Over the last 12 months how often did your partner threaten you with physical harm?: Never    Over the last 12 months how often did your partner scream or curse at you?: Never      Modena Callander, M.D. Ph.D.  [facility] [address], [address] [place], [place] [address] Ph: [phone] Fax: [phone]  CC:  Allen Lauraine CROME, PA-C [address] [place],  [place] [address]  Allen Lauraine CROME, PA-C

## 2023-10-24 ENCOUNTER — Ambulatory Visit: Admitting: Neurology

## 2023-10-25 ENCOUNTER — Other Ambulatory Visit: Payer: Self-pay | Admitting: Physician Assistant

## 2023-10-25 DIAGNOSIS — Z959 Presence of cardiac and vascular implant and graft, unspecified: Secondary | ICD-10-CM

## 2023-10-25 DIAGNOSIS — I6501 Occlusion and stenosis of right vertebral artery: Secondary | ICD-10-CM

## 2023-11-03 ENCOUNTER — Ambulatory Visit
Admission: RE | Admit: 2023-11-03 | Discharge: 2023-11-03 | Disposition: A | Discharge status: Home / Self Care | Source: Ambulatory Visit | Attending: Physician Assistant | Admitting: Physician Assistant

## 2023-11-03 DIAGNOSIS — Z959 Presence of cardiac and vascular implant and graft, unspecified: Secondary | ICD-10-CM

## 2023-11-03 DIAGNOSIS — I6501 Occlusion and stenosis of right vertebral artery: Secondary | ICD-10-CM

## 2023-11-03 MED ORDER — IOPAMIDOL (ISOVUE-370) INJECTION 76%
75.0000 mL | Freq: Once | INTRAVENOUS | Status: AC | PRN
  Administered 2023-11-03: 75 mL via INTRAVENOUS
